# Patient Record
Sex: Female | Born: 1955 | Race: Black or African American | Hispanic: No | Marital: Married | Smoking: Never smoker
Health system: Southern US, Community
[De-identification: ages and names within clinical notes are randomized; demographics above are authoritative.]

## PROBLEM LIST (undated history)

## (undated) DIAGNOSIS — I10 Essential (primary) hypertension: Secondary | ICD-10-CM

## (undated) DIAGNOSIS — E079 Disorder of thyroid, unspecified: Secondary | ICD-10-CM

---

## 2004-11-22 ENCOUNTER — Ambulatory Visit: Payer: Self-pay

## 2005-10-26 ENCOUNTER — Ambulatory Visit: Payer: Self-pay | Admitting: Endocrinology

## 2006-11-21 ENCOUNTER — Ambulatory Visit: Payer: Self-pay | Admitting: Family Medicine

## 2008-01-15 ENCOUNTER — Ambulatory Visit: Payer: Self-pay | Admitting: Family Medicine

## 2008-03-21 ENCOUNTER — Ambulatory Visit: Payer: Self-pay | Admitting: Gastroenterology

## 2009-01-14 ENCOUNTER — Ambulatory Visit: Payer: Self-pay | Admitting: Family Medicine

## 2009-01-21 ENCOUNTER — Ambulatory Visit: Payer: Self-pay | Admitting: Family Medicine

## 2010-01-12 ENCOUNTER — Ambulatory Visit: Payer: Self-pay | Admitting: Family Medicine

## 2010-01-21 ENCOUNTER — Ambulatory Visit: Payer: Self-pay | Admitting: Family Medicine

## 2011-06-29 ENCOUNTER — Ambulatory Visit: Payer: Self-pay | Admitting: Family Medicine

## 2012-07-04 ENCOUNTER — Ambulatory Visit: Payer: Self-pay | Admitting: Family Medicine

## 2013-07-09 ENCOUNTER — Ambulatory Visit: Payer: Self-pay | Admitting: Family Medicine

## 2014-05-23 ENCOUNTER — Other Ambulatory Visit: Payer: Self-pay | Admitting: Family Medicine

## 2014-05-23 DIAGNOSIS — E049 Nontoxic goiter, unspecified: Secondary | ICD-10-CM

## 2014-05-27 ENCOUNTER — Ambulatory Visit
Admission: RE | Admit: 2014-05-27 | Discharge: 2014-05-27 | Disposition: A | Discharge status: Home / Self Care | Payer: BLUE CROSS/BLUE SHIELD | Source: Ambulatory Visit | Attending: Family Medicine | Admitting: Family Medicine

## 2014-05-27 DIAGNOSIS — E010 Iodine-deficiency related diffuse (endemic) goiter: Secondary | ICD-10-CM | POA: Insufficient documentation

## 2014-05-27 DIAGNOSIS — E049 Nontoxic goiter, unspecified: Secondary | ICD-10-CM | POA: Diagnosis present

## 2014-07-07 ENCOUNTER — Other Ambulatory Visit: Payer: Self-pay | Admitting: Family Medicine

## 2014-07-07 DIAGNOSIS — Z1231 Encounter for screening mammogram for malignant neoplasm of breast: Secondary | ICD-10-CM

## 2014-07-15 ENCOUNTER — Ambulatory Visit
Admission: RE | Admit: 2014-07-15 | Discharge: 2014-07-15 | Disposition: A | Discharge status: Home / Self Care | Payer: BLUE CROSS/BLUE SHIELD | Source: Ambulatory Visit | Attending: Family Medicine | Admitting: Family Medicine

## 2014-07-15 DIAGNOSIS — Z1231 Encounter for screening mammogram for malignant neoplasm of breast: Secondary | ICD-10-CM | POA: Insufficient documentation

## 2014-12-05 ENCOUNTER — Inpatient Hospital Stay: Payer: Worker's Compensation | Admitting: Anesthesiology

## 2014-12-05 ENCOUNTER — Inpatient Hospital Stay: Payer: Worker's Compensation

## 2014-12-05 ENCOUNTER — Encounter
Admission: EM | Disposition: A | Discharge status: Home Health | Payer: Self-pay | Source: Home / Self Care | Attending: Internal Medicine

## 2014-12-05 ENCOUNTER — Emergency Department: Payer: Worker's Compensation

## 2014-12-05 ENCOUNTER — Encounter: Payer: Self-pay | Admitting: Emergency Medicine

## 2014-12-05 ENCOUNTER — Inpatient Hospital Stay
Admission: EM | Admit: 2014-12-05 | Discharge: 2014-12-08 | DRG: 470 | Disposition: A | Discharge status: Home Health | Payer: Worker's Compensation | Attending: Internal Medicine | Admitting: Internal Medicine

## 2014-12-05 DIAGNOSIS — D62 Acute posthemorrhagic anemia: Secondary | ICD-10-CM | POA: Diagnosis not present

## 2014-12-05 DIAGNOSIS — W19XXXA Unspecified fall, initial encounter: Secondary | ICD-10-CM | POA: Diagnosis present

## 2014-12-05 DIAGNOSIS — G8918 Other acute postprocedural pain: Secondary | ICD-10-CM

## 2014-12-05 DIAGNOSIS — R739 Hyperglycemia, unspecified: Secondary | ICD-10-CM

## 2014-12-05 DIAGNOSIS — S72012A Unspecified intracapsular fracture of left femur, initial encounter for closed fracture: Secondary | ICD-10-CM | POA: Diagnosis present

## 2014-12-05 DIAGNOSIS — Z419 Encounter for procedure for purposes other than remedying health state, unspecified: Secondary | ICD-10-CM

## 2014-12-05 DIAGNOSIS — M1612 Unilateral primary osteoarthritis, left hip: Secondary | ICD-10-CM | POA: Diagnosis present

## 2014-12-05 DIAGNOSIS — Z880 Allergy status to penicillin: Secondary | ICD-10-CM

## 2014-12-05 DIAGNOSIS — I10 Essential (primary) hypertension: Secondary | ICD-10-CM | POA: Diagnosis present

## 2014-12-05 DIAGNOSIS — E876 Hypokalemia: Secondary | ICD-10-CM | POA: Diagnosis present

## 2014-12-05 DIAGNOSIS — S72002A Fracture of unspecified part of neck of left femur, initial encounter for closed fracture: Secondary | ICD-10-CM | POA: Diagnosis present

## 2014-12-05 DIAGNOSIS — Z79899 Other long term (current) drug therapy: Secondary | ICD-10-CM | POA: Diagnosis not present

## 2014-12-05 DIAGNOSIS — E039 Hypothyroidism, unspecified: Secondary | ICD-10-CM | POA: Diagnosis present

## 2014-12-05 DIAGNOSIS — E1165 Type 2 diabetes mellitus with hyperglycemia: Secondary | ICD-10-CM | POA: Diagnosis present

## 2014-12-05 HISTORY — PX: TOTAL HIP ARTHROPLASTY: SHX124

## 2014-12-05 HISTORY — DX: Essential (primary) hypertension: I10

## 2014-12-05 HISTORY — DX: Disorder of thyroid, unspecified: E07.9

## 2014-12-05 LAB — CBC WITH DIFFERENTIAL/PLATELET
Basophils Absolute: 0 10*3/uL (ref 0–0.1)
Basophils Relative: 1 %
EOS PCT: 0 %
Eosinophils Absolute: 0 10*3/uL (ref 0–0.7)
HCT: 36.6 % (ref 35.0–47.0)
Hemoglobin: 12.3 g/dL (ref 12.0–16.0)
LYMPHS ABS: 1.3 10*3/uL (ref 1.0–3.6)
LYMPHS PCT: 17 %
MCH: 30.5 pg (ref 26.0–34.0)
MCHC: 33.6 g/dL (ref 32.0–36.0)
MCV: 90.6 fL (ref 80.0–100.0)
MONO ABS: 0.4 10*3/uL (ref 0.2–0.9)
MONOS PCT: 5 %
Neutro Abs: 5.9 10*3/uL (ref 1.4–6.5)
Neutrophils Relative %: 77 %
PLATELETS: 250 10*3/uL (ref 150–440)
RBC: 4.04 MIL/uL (ref 3.80–5.20)
RDW: 13.8 % (ref 11.5–14.5)
WBC: 7.7 10*3/uL (ref 3.6–11.0)

## 2014-12-05 LAB — COMPREHENSIVE METABOLIC PANEL
ALT: 13 U/L — AB (ref 14–54)
AST: 21 U/L (ref 15–41)
Albumin: 3.8 g/dL (ref 3.5–5.0)
Alkaline Phosphatase: 48 U/L (ref 38–126)
Anion gap: 6 (ref 5–15)
BUN: 12 mg/dL (ref 6–20)
CALCIUM: 8.7 mg/dL — AB (ref 8.9–10.3)
CO2: 29 mmol/L (ref 22–32)
CREATININE: 0.62 mg/dL (ref 0.44–1.00)
Chloride: 106 mmol/L (ref 101–111)
Glucose, Bld: 119 mg/dL — ABNORMAL HIGH (ref 65–99)
Potassium: 3.4 mmol/L — ABNORMAL LOW (ref 3.5–5.1)
Sodium: 141 mmol/L (ref 135–145)
Total Bilirubin: 0.9 mg/dL (ref 0.3–1.2)
Total Protein: 7.4 g/dL (ref 6.5–8.1)

## 2014-12-05 LAB — SURGICAL PCR SCREEN
MRSA, PCR: NEGATIVE
STAPHYLOCOCCUS AUREUS: NEGATIVE

## 2014-12-05 LAB — PROTIME-INR
INR: 1.1
PROTHROMBIN TIME: 14.4 s (ref 11.4–15.0)

## 2014-12-05 LAB — MAGNESIUM: Magnesium: 1.9 mg/dL (ref 1.7–2.4)

## 2014-12-05 SURGERY — ARTHROPLASTY, HIP, TOTAL, ANTERIOR APPROACH
Anesthesia: General | Site: Hip | Laterality: Left | Wound class: Clean

## 2014-12-05 MED ORDER — ACETAMINOPHEN 500 MG PO TABS
1000.0000 mg | ORAL_TABLET | Freq: Four times a day (QID) | ORAL | Status: AC
  Administered 2014-12-05 – 2014-12-06 (×3): 1000 mg via ORAL
  Filled 2014-12-05 (×4): qty 2

## 2014-12-05 MED ORDER — IPRATROPIUM BROMIDE 0.06 % NA SOLN
2.0000 | Freq: Three times a day (TID) | NASAL | Status: DC
  Administered 2014-12-05 – 2014-12-08 (×9): 2 via NASAL
  Filled 2014-12-05: qty 15

## 2014-12-05 MED ORDER — MAGNESIUM HYDROXIDE 400 MG/5ML PO SUSP
30.0000 mL | Freq: Every day | ORAL | Status: DC | PRN
  Administered 2014-12-06: 30 mL via ORAL
  Filled 2014-12-05: qty 30

## 2014-12-05 MED ORDER — ALUM & MAG HYDROXIDE-SIMETH 200-200-20 MG/5ML PO SUSP: 30.0000 mL | ORAL | Status: DC | PRN

## 2014-12-05 MED ORDER — PHENYLEPHRINE HCL 10 MG/ML IJ SOLN
INTRAMUSCULAR | Status: DC | PRN
  Administered 2014-12-05: 100 ug via INTRAVENOUS

## 2014-12-05 MED ORDER — OXYCODONE HCL 5 MG PO TABS: 5.0000 mg | ORAL_TABLET | Freq: Once | ORAL | Status: DC | PRN

## 2014-12-05 MED ORDER — OXYCODONE HCL 5 MG/5ML PO SOLN: 5.0000 mg | Freq: Once | ORAL | Status: DC | PRN

## 2014-12-05 MED ORDER — OXYCODONE HCL 5 MG PO TABS
5.0000 mg | ORAL_TABLET | ORAL | Status: DC | PRN
  Administered 2014-12-06: 5 mg via ORAL
  Administered 2014-12-06: 10 mg via ORAL
  Administered 2014-12-06: 5 mg via ORAL
  Administered 2014-12-07 (×3): 10 mg via ORAL
  Filled 2014-12-05: qty 2
  Filled 2014-12-05: qty 1
  Filled 2014-12-05: qty 2
  Filled 2014-12-05: qty 1
  Filled 2014-12-05 (×2): qty 2

## 2014-12-05 MED ORDER — HYDROMORPHONE HCL 1 MG/ML IJ SOLN
1.0000 mg | Freq: Once | INTRAMUSCULAR | Status: AC
  Administered 2014-12-05: 1 mg via INTRAVENOUS
  Filled 2014-12-05: qty 1

## 2014-12-05 MED ORDER — TRANEXAMIC ACID 1000 MG/10ML IV SOLN
1000.0000 mg | INTRAVENOUS | Status: DC | PRN
  Administered 2014-12-05: 1000 mg via INTRAVENOUS

## 2014-12-05 MED ORDER — DOCUSATE SODIUM 100 MG PO CAPS
100.0000 mg | ORAL_CAPSULE | Freq: Two times a day (BID) | ORAL | Status: DC
  Administered 2014-12-05 – 2014-12-08 (×6): 100 mg via ORAL
  Filled 2014-12-05 (×6): qty 1

## 2014-12-05 MED ORDER — PHENOL 1.4 % MT LIQD: 1.0000 | OROMUCOSAL | Status: DC | PRN

## 2014-12-05 MED ORDER — ENOXAPARIN SODIUM 40 MG/0.4ML ~~LOC~~ SOLN
40.0000 mg | SUBCUTANEOUS | Status: DC
  Administered 2014-12-06 – 2014-12-08 (×3): 40 mg via SUBCUTANEOUS
  Filled 2014-12-05 (×3): qty 0.4

## 2014-12-05 MED ORDER — HYDROCHLOROTHIAZIDE 25 MG PO TABS
25.0000 mg | ORAL_TABLET | Freq: Every day | ORAL | Status: DC
  Administered 2014-12-06 – 2014-12-08 (×3): 25 mg via ORAL
  Filled 2014-12-05 (×3): qty 1

## 2014-12-05 MED ORDER — DEXAMETHASONE SODIUM PHOSPHATE 4 MG/ML IJ SOLN
INTRAMUSCULAR | Status: DC | PRN
  Administered 2014-12-05: 10 mg via INTRAVENOUS

## 2014-12-05 MED ORDER — PROPOFOL 10 MG/ML IV BOLUS
INTRAVENOUS | Status: DC | PRN
  Administered 2014-12-05: 150 mg via INTRAVENOUS

## 2014-12-05 MED ORDER — MORPHINE SULFATE (PF) 2 MG/ML IV SOLN: 2.0000 mg | INTRAVENOUS | Status: DC | PRN

## 2014-12-05 MED ORDER — NEOMYCIN-POLYMYXIN B GU 40-200000 IR SOLN
Status: DC | PRN
  Administered 2014-12-05: 4 mL

## 2014-12-05 MED ORDER — ACETAMINOPHEN 10 MG/ML IV SOLN
INTRAVENOUS | Status: DC | PRN
  Administered 2014-12-05: 1000 mg via INTRAVENOUS

## 2014-12-05 MED ORDER — ACETAMINOPHEN 650 MG RE SUPP: 650.0000 mg | Freq: Four times a day (QID) | RECTAL | Status: DC | PRN

## 2014-12-05 MED ORDER — CLINDAMYCIN PHOSPHATE 900 MG/50ML IV SOLN
900.0000 mg | Freq: Once | INTRAVENOUS | Status: AC
  Administered 2014-12-05: 900 mg via INTRAVENOUS
  Filled 2014-12-05 (×2): qty 50

## 2014-12-05 MED ORDER — FENTANYL CITRATE (PF) 100 MCG/2ML IJ SOLN
INTRAMUSCULAR | Status: DC | PRN
Start: 2014-12-05 — End: 2014-12-05
  Administered 2014-12-05 (×2): 50 ug via INTRAVENOUS
  Administered 2014-12-05: 100 ug via INTRAVENOUS

## 2014-12-05 MED ORDER — POTASSIUM CHLORIDE IN NACL 20-0.9 MEQ/L-% IV SOLN
INTRAVENOUS | Status: DC
Start: 2014-12-05 — End: 2014-12-05
  Administered 2014-12-05: 12:00:00 via INTRAVENOUS
  Filled 2014-12-05 (×3): qty 1000

## 2014-12-05 MED ORDER — ACETAMINOPHEN 325 MG PO TABS: 650.0000 mg | ORAL_TABLET | Freq: Four times a day (QID) | ORAL | Status: DC | PRN

## 2014-12-05 MED ORDER — MORPHINE SULFATE (PF) 4 MG/ML IV SOLN
4.0000 mg | INTRAVENOUS | Status: DC | PRN
  Administered 2014-12-05: 4 mg via INTRAVENOUS
  Filled 2014-12-05: qty 1

## 2014-12-05 MED ORDER — DIPHENHYDRAMINE HCL 12.5 MG/5ML PO ELIX: 12.5000 mg | ORAL_SOLUTION | ORAL | Status: DC | PRN

## 2014-12-05 MED ORDER — LACTATED RINGERS IV SOLN
INTRAVENOUS | Status: DC | PRN
  Administered 2014-12-05 (×2): via INTRAVENOUS

## 2014-12-05 MED ORDER — METHOCARBAMOL 500 MG PO TABS
500.0000 mg | ORAL_TABLET | Freq: Four times a day (QID) | ORAL | Status: DC | PRN
  Administered 2014-12-06: 500 mg via ORAL
  Filled 2014-12-05: qty 1

## 2014-12-05 MED ORDER — MIDAZOLAM HCL 2 MG/2ML IJ SOLN
INTRAMUSCULAR | Status: DC | PRN
  Administered 2014-12-05: 2 mg via INTRAVENOUS

## 2014-12-05 MED ORDER — FENTANYL CITRATE (PF) 100 MCG/2ML IJ SOLN: 25.0000 ug | INTRAMUSCULAR | Status: DC | PRN

## 2014-12-05 MED ORDER — ONDANSETRON HCL 4 MG PO TABS: 4.0000 mg | ORAL_TABLET | Freq: Four times a day (QID) | ORAL | Status: DC | PRN

## 2014-12-05 MED ORDER — ONDANSETRON HCL 4 MG/2ML IJ SOLN
4.0000 mg | Freq: Four times a day (QID) | INTRAMUSCULAR | Status: DC | PRN
  Administered 2014-12-05: 4 mg via INTRAVENOUS

## 2014-12-05 MED ORDER — ROCURONIUM BROMIDE 100 MG/10ML IV SOLN
INTRAVENOUS | Status: DC | PRN
  Administered 2014-12-05: 30 mg via INTRAVENOUS
  Administered 2014-12-05: 20 mg via INTRAVENOUS

## 2014-12-05 MED ORDER — BISACODYL 10 MG RE SUPP: 10.0000 mg | Freq: Every day | RECTAL | Status: DC | PRN

## 2014-12-05 MED ORDER — CLINDAMYCIN PHOSPHATE 900 MG/50ML IV SOLN
900.0000 mg | Freq: Four times a day (QID) | INTRAVENOUS | Status: AC
  Administered 2014-12-05 – 2014-12-06 (×3): 900 mg via INTRAVENOUS
  Filled 2014-12-05 (×3): qty 50

## 2014-12-05 MED ORDER — LEVOTHYROXINE SODIUM 88 MCG PO TABS: 88.0000 ug | ORAL_TABLET | ORAL | Status: DC

## 2014-12-05 MED ORDER — KETOROLAC TROMETHAMINE 30 MG/ML IJ SOLN
INTRAMUSCULAR | Status: DC | PRN
  Administered 2014-12-05: 30 mg via INTRAVENOUS

## 2014-12-05 MED ORDER — ALBUTEROL SULFATE (2.5 MG/3ML) 0.083% IN NEBU: 2.5000 mg | INHALATION_SOLUTION | RESPIRATORY_TRACT | Status: DC | PRN

## 2014-12-05 MED ORDER — MAGNESIUM CITRATE PO SOLN: 1.0000 | Freq: Once | ORAL | Status: DC | PRN

## 2014-12-05 MED ORDER — BUPIVACAINE-EPINEPHRINE 0.25% -1:200000 IJ SOLN
INTRAMUSCULAR | Status: DC | PRN
  Administered 2014-12-05: 30 mL

## 2014-12-05 MED ORDER — ZOLPIDEM TARTRATE 5 MG PO TABS: 5.0000 mg | ORAL_TABLET | Freq: Every evening | ORAL | Status: DC | PRN

## 2014-12-05 MED ORDER — SODIUM CHLORIDE 0.9 % IV SOLN
INTRAVENOUS | Status: DC
  Administered 2014-12-05 – 2014-12-08 (×5): via INTRAVENOUS

## 2014-12-05 MED ORDER — MENTHOL 3 MG MT LOZG: 1.0000 | LOZENGE | OROMUCOSAL | Status: DC | PRN

## 2014-12-05 MED ORDER — DEXTROSE 5 % IV SOLN: 500.0000 mg | Freq: Four times a day (QID) | INTRAVENOUS | Status: DC | PRN

## 2014-12-05 MED ORDER — SODIUM CHLORIDE 0.9 % IV SOLN: INTRAVENOUS | Status: DC

## 2014-12-05 MED ORDER — LIDOCAINE HCL (CARDIAC) 20 MG/ML IV SOLN
INTRAVENOUS | Status: DC | PRN
  Administered 2014-12-05: 80 mg via INTRAVENOUS

## 2014-12-05 MED ORDER — SUGAMMADEX SODIUM 200 MG/2ML IV SOLN
INTRAVENOUS | Status: DC | PRN
  Administered 2014-12-05: 142 mg via INTRAVENOUS

## 2014-12-05 SURGICAL SUPPLY — 43 items
BLADE SAW 1/2 (BLADE) ×3 IMPLANT
BNDG COHESIVE 6X5 TAN STRL LF (GAUZE/BANDAGES/DRESSINGS) ×6 IMPLANT
CANISTER SUCT 1200ML W/VALVE (MISCELLANEOUS) ×3 IMPLANT
CAPT HIP TOTAL 3 ×3 IMPLANT
CATH FOL LEG HOLDER (MISCELLANEOUS) ×3 IMPLANT
CATH TRAY METER 16FR LF (MISCELLANEOUS) ×3 IMPLANT
CHLORAPREP W/TINT 26ML (MISCELLANEOUS) ×3 IMPLANT
DRAPE C-ARM XRAY 36X54 (DRAPES) ×3 IMPLANT
DRAPE INCISE IOBAN 66X60 STRL (DRAPES) IMPLANT
DRAPE POUCH INSTRU U-SHP 10X18 (DRAPES) ×3 IMPLANT
DRAPE SHEET LG 3/4 BI-LAMINATE (DRAPES) ×9 IMPLANT
DRAPE TABLE BACK 80X90 (DRAPES) ×3 IMPLANT
ELECT BLADE 6.5 EXT (BLADE) ×3 IMPLANT
GAUZE SPONGE 4X4 12PLY STRL (GAUZE/BANDAGES/DRESSINGS) ×3 IMPLANT
GLOVE BIOGEL PI IND STRL 9 (GLOVE) ×1 IMPLANT
GLOVE BIOGEL PI INDICATOR 9 (GLOVE) ×2
GLOVE SURG ORTHO 9.0 STRL STRW (GLOVE) ×3 IMPLANT
GOWN SPECIALTY ULTRA XL (MISCELLANEOUS) ×3 IMPLANT
GOWN STRL REUS W/ TWL LRG LVL3 (GOWN DISPOSABLE) ×1 IMPLANT
GOWN STRL REUS W/TWL LRG LVL3 (GOWN DISPOSABLE) ×2
HEMOVAC 400CC 10FR (MISCELLANEOUS) ×3 IMPLANT
HOOD PEEL AWAY FACE SHEILD DIS (HOOD) ×3 IMPLANT
MAT BLUE FLOOR 46X72 FLO (MISCELLANEOUS) ×3 IMPLANT
NDL SAFETY 18GX1.5 (NEEDLE) ×3 IMPLANT
NEEDLE SPNL 18GX3.5 QUINCKE PK (NEEDLE) ×3 IMPLANT
NS IRRIG 1000ML POUR BTL (IV SOLUTION) ×3 IMPLANT
PACK HIP COMPR (MISCELLANEOUS) ×3 IMPLANT
SOL PREP PVP 2OZ (MISCELLANEOUS) ×3
SOLUTION PREP PVP 2OZ (MISCELLANEOUS) ×1 IMPLANT
STAPLER SKIN PROX 35W (STAPLE) ×3 IMPLANT
STRAP SAFETY BODY (MISCELLANEOUS) ×3 IMPLANT
SUT DVC 2 QUILL PDO  T11 36X36 (SUTURE) ×2
SUT DVC 2 QUILL PDO T11 36X36 (SUTURE) ×1 IMPLANT
SUT DVC QUILL MONODERM 30X30 (SUTURE) ×3 IMPLANT
SUT ETHIBOND NAB CT1 #1 30IN (SUTURE) ×3 IMPLANT
SUT SILK 0 (SUTURE) ×2
SUT SILK 0 30XBRD TIE 6 (SUTURE) ×1 IMPLANT
SUT VIC AB 1 CT1 36 (SUTURE) ×3 IMPLANT
SYR 20CC LL (SYRINGE) ×3 IMPLANT
SYR 30ML LL (SYRINGE) ×3 IMPLANT
TAPE MICROFOAM 4IN (TAPE) ×3 IMPLANT
TUBE KAMVAC SUCTION (TUBING) ×3 IMPLANT
WATER STERILE IRR 1000ML POUR (IV SOLUTION) ×3 IMPLANT

## 2014-12-05 NOTE — Care Management Note (Signed)
Case Management Note  Patient Details  Name: Latoya Warren MRN: [id number] Date of Birth: 1955-08-17  Subjective/Objective:       Call to Will at [facility] requesting a bedside commode and a rolling walker to be delivered to [name] room today. She is scheduled for total hip surgery this afternoon.              Action/Plan:   Expected Discharge Date:                  Expected Discharge Plan:     In-House Referral:     Discharge planning Services     Post Acute Care Choice:    Choice offered to:     DME Arranged:    DME Agency:     HH Arranged:    HH Agency:     Status of Service:     Medicare Important Message Given:    Date Medicare IM Given:    Medicare IM give by:    Date Additional Medicare IM Given:    Additional Medicare Important Message give by:     If discussed at Long Length of Stay Meetings, dates discussed:    Additional Comments:  Daxten Kovalenko A, RN 12/05/2014, 11:59 AM

## 2014-12-05 NOTE — H&P (Signed)
[facility] [place] [facility]   PATIENT NAME: Latoya Warren    MR#:  [id number]  DATE OF BIRTH:  [birth year]  DATE OF ADMISSION:  12/05/2014  PRIMARY CARE PHYSICIAN: Leim Fabry, MD   REQUESTING/REFERRING PHYSICIAN: Emily Filbert, MD  CHIEF COMPLAINT:   Chief Complaint  Patient presents with  . Fall   fall accident at work today.  HISTORY OF PRESENT ILLNESS:  Latoya Warren  is a 59 y.o. female with a known history of hypertension and hypothyroidism. Patient feel by accident at work this morning. She denies any syncope or loss of consciousness or seizure. She complains of left hip pain after fall. X-ray shows left femoral fracture.  PAST MEDICAL HISTORY:   Past Medical History  Diagnosis Date  . Hypertension   . Thyroid disease     PAST SURGICAL HISTORY:  History reviewed. No pertinent past surgical history.  SOCIAL HISTORY:   Social History  Substance Use Topics  . Smoking status: Never Smoker   . Smokeless tobacco: Not on file  . Alcohol Use: No    FAMILY HISTORY:  No family history on file.  DRUG ALLERGIES:   Allergies  Allergen Reactions  . Penicillins Rash    REVIEW OF SYSTEMS:  CONSTITUTIONAL: No fever, fatigue or weakness.  EYES: No blurred or double vision.  EARS, NOSE, AND THROAT: No tinnitus or ear pain.  RESPIRATORY: No cough, shortness of breath, wheezing or hemoptysis.  CARDIOVASCULAR: No chest pain, orthopnea, edema.  GASTROINTESTINAL: No nausea, vomiting, diarrhea or abdominal pain.  GENITOURINARY: No dysuria, hematuria.  ENDOCRINE: No polyuria, nocturia,  HEMATOLOGY: No anemia, easy bruising or bleeding SKIN: No rash or lesion. MUSCULOSKELETAL: Left hip pain. NEUROLOGIC: No tingling, numbness, weakness.  PSYCHIATRY: No anxiety or depression.   MEDICATIONS AT HOME:   Prior to Admission medications   Medication Sig Start Date End Date Taking? Authorizing Provider  acetaminophen (RA  ACETAMINOPHEN) 650 MG CR tablet Take 650 mg by mouth every 8 (eight) hours as needed for pain.    Yes Historical Provider, MD  hydrochlorothiazide (HYDRODIURIL) 25 MG tablet Take 25 mg by mouth daily.   Yes Historical Provider, MD  ipratropium (ATROVENT) 0.06 % nasal spray Place 2 sprays into both nostrils 3 (three) times daily. For 14 days 12/02/14  Yes Historical Provider, MD  levothyroxine (SYNTHROID, LEVOTHROID) 75 MCG tablet Take 75 mcg by mouth daily. Except on Tuesday and Thursday   Yes Historical Provider, MD  levothyroxine (SYNTHROID, LEVOTHROID) 88 MCG tablet Take 88 mcg by mouth daily. On Tuesday and Thursday   Yes Historical Provider, MD  Multiple Vitamins-Minerals (MULTIVITAMIN WITH MINERALS) tablet Take 1 tablet by mouth daily.   Yes Historical Provider, MD      VITAL SIGNS:  Blood pressure 149/80, pulse 94, temperature 98.3 F (36.8 C), temperature source Oral, resp. rate 16, height  (1.6 m), weight 71.033 kg (156 lb 9.6 oz), SpO2 94 %.  PHYSICAL EXAMINATION:  GENERAL:  59 y.o.-year-old patient lying in the bed with no acute distress.  EYES: Pupils equal, round, reactive to light and accommodation. No scleral icterus. Extraocular muscles intact.  HEENT: Head atraumatic, normocephalic. Oropharynx and nasopharynx clear. Dry oral mucosa. NECK:  Supple, no jugular venous distention. No thyroid enlargement, no tenderness.  LUNGS: Normal breath sounds bilaterally, no wheezing, rales,rhonchi or crepitation. No use of accessory muscles of respiration.  CARDIOVASCULAR: S1, S2 normal. No murmurs, rubs, or gallops.  ABDOMEN: Soft, nontender, nondistended. Bowel sounds present. No organomegaly or  mass.  EXTREMITIES: No pedal edema, cyanosis, or clubbing.  NEUROLOGIC: Cranial nerves II through XII are intact. Muscle strength 5/5 in all extremities except left lower extremity. Sensation intact. Gait not checked.  PSYCHIATRIC: The patient is alert and oriented x 3.  SKIN: No obvious rash,  lesion, or ulcer.   LABORATORY PANEL:   CBC  Recent Labs Lab 12/05/14 0908  WBC 7.7  HGB 12.3  HCT 36.6  PLT 250   ------------------------------------------------------------------------------------------------------------------  Chemistries   Recent Labs Lab 12/05/14 0951  NA 141  K 3.4*  CL 106  CO2 29  GLUCOSE 119*  BUN 12  CREATININE 0.62  CALCIUM 8.7*  AST 21  ALT 13*  ALKPHOS 48  BILITOT 0.9   ------------------------------------------------------------------------------------------------------------------  Cardiac Enzymes No results for input(s): TROPONINI in the last 168 hours. ------------------------------------------------------------------------------------------------------------------  RADIOLOGY:  Dg Chest 1 View  12/05/2014  CLINICAL DATA:  Pain following fall EXAM: CHEST 1 VIEW COMPARISON:  None. FINDINGS: Lungs are clear. Heart size and pulmonary vascularity are normal. No adenopathy. No pneumothorax. There is thoracolumbar levoscoliosis. No acute fracture evident. IMPRESSION: No edema or consolidation. Electronically Signed   By: Bretta BangWilliam  Woodruff III M.D.   On: 12/05/2014 08:46   Dg Pelvis 1-2 Views  12/05/2014  CLINICAL DATA:  Fall onto concrete at work with hip pain, initial encounter EXAM: PELVIS - 1-2 VIEW COMPARISON:  None. FINDINGS: There is evidence of subcapital femoral neck fracture with impaction and angulation at the fracture site. The pelvic ring is intact. IMPRESSION: Subcapital left femoral neck fracture. Electronically Signed   By: Alcide CleverMark  Lukens M.D.   On: 12/05/2014 08:45   Dg Femur Port Min 2 Views Left  12/05/2014  CLINICAL DATA:  Recent fall at work onto concrete with left hip pain, initial encounter EXAM: LEFT FEMUR PORTABLE 2 VIEWS COMPARISON:  None. FINDINGS: Subcapital left femoral neck fracture is noted with impaction and angulation at the fracture site. The more distal femur is intact. No gross soft tissue abnormality is  seen. IMPRESSION: Subcapital left femoral neck fracture. Electronically Signed   By: Alcide CleverMark  Lukens M.D.   On: 12/05/2014 08:48    EKG:   Orders placed or performed during the hospital encounter of 12/05/14  . ED EKG  . ED EKG  . EKG 12-Lead  . EKG 12-Lead  . EKG 12-Lead  . EKG 12-Lead    IMPRESSION AND PLAN:    Left hip fracture Hypokalemia HTN Hypothyrodism.  Follow up Dr. Rosita KeaMenz for surgery. DVT prophylaxis and PT after surgery. Pain control. Potassium supplement. Follow up BMP and magnesium in a.m. Continue HCTZ, synthroid.   All the records are reviewed and case discussed with ED provider. Management plans discussed with the patient, her husband and they are in agreement.  CODE STATUS: Full code  TOTAL TIME TAKING CARE OF THIS PATIENT: 50 minutes.    Shaune Pollackhen, Brnadon Eoff M.D on 12/05/2014 at 11:41 AM  Between 7am to 6pm - Pager - [phone]  After 6pm go to [email or web address] - password [email or web address] [facility]  [facility]  Office  [phone]  CC: Primary care physician; Leim FabryALDRIDGE,BARBARA, MD

## 2014-12-05 NOTE — Anesthesia Procedure Notes (Signed)
Procedure Name: Intubation Date/Time: 12/05/2014 4:38 PM Performed by: Stormy FabianURTIS, Emilie Carp Pre-anesthesia Checklist: Patient identified, Emergency Drugs available, Suction available and Patient being monitored Patient Re-evaluated:Patient Re-evaluated prior to inductionOxygen Delivery Method: Circle system utilized Preoxygenation: Pre-oxygenation with 100% oxygen Intubation Type: IV induction, Cricoid Pressure applied and Rapid sequence Ventilation: Mask ventilation without difficulty Laryngoscope Size: Mac and 3 Grade View: Grade I Tube type: Oral Tube size: 7.0 mm Number of attempts: 1 Airway Equipment and Method: Stylet Placement Confirmation: ETT inserted through vocal cords under direct vision,  positive ETCO2 and breath sounds checked- equal and bilateral Secured at: 21 cm Tube secured with: Tape Dental Injury: Teeth and Oropharynx as per pre-operative assessment

## 2014-12-05 NOTE — ED Provider Notes (Signed)
[facility] Emergency Department Provider Note     Time seen: ----------------------------------------- 7:42 AM on 12/05/2014 -----------------------------------------    I have reviewed the triage vital signs and the nursing notes.   HISTORY  Chief Complaint No chief complaint on file.    HPI Latoya Warren is a 59 y.o. female brought to the ER by EMS after a fall while at work. Patient states she fell and landed on her left side. She is complaining of severe degenerative pain in the left hip and some also in the left knee. Movement of the left leg makes her symptoms worse, she received morphine in route which helped her symptoms.   No past medical history on file.  There are no active problems to display for this patient.   No past surgical history on file.  Allergies Review of patient's allergies indicates not on file.  Social History Social History  Substance Use Topics  . Smoking status: Not on file  . Smokeless tobacco: Not on file  . Alcohol Use: Not on file    Review of Systems Constitutional: Negative for fever. Eyes: Negative for visual changes. ENT: Negative for sore throat. Cardiovascular: Negative for chest pain. Respiratory: Negative for shortness of breath. Gastrointestinal: Negative for abdominal pain, vomiting and diarrhea. Genitourinary: Negative for dysuria. Musculoskeletal: Positive for left hip and knee pain Skin: Negative for rash. Neurological: Negative for headaches, focal weakness or numbness.  10-point ROS otherwise negative.  ____________________________________________   PHYSICAL EXAM:  VITAL SIGNS: ED Triage Vitals  Enc Vitals Group     BP --      Pulse --      Resp --      Temp --      Temp src --      SpO2 --      Weight --      Height --      Head Cir --      Peak Flow --      Pain Score --      Pain Loc --      Pain Edu? --      Excl. in GC? --     Constitutional: Alert and  oriented. Well appearing and in no distress. Eyes: Conjunctivae are normal. PERRL. Normal extraocular movements. ENT   Head: Normocephalic and atraumatic.   Nose: No congestion/rhinnorhea.   Mouth/Throat: Mucous membranes are moist.   Neck: No stridor. Cardiovascular: Normal rate, regular rhythm. Normal and symmetric distal pulses are present in all extremities. No murmurs, rubs, or gallops. Respiratory: Normal respiratory effort without tachypnea nor retractions. Breath sounds are clear and equal bilaterally. No wheezes/rales/rhonchi. Gastrointestinal: Soft and nontender. No distention. No abdominal bruits.  Musculoskeletal: Severe pain with range of motion of the left hip. There is no focal tenderness on examination of her knee. Neurologic:  Normal speech and language. No gross focal neurologic deficits are appreciated. Speech is normal. No gait instability. Skin:  Skin is warm, dry and intact. No rash noted. Psychiatric: Mood and affect are normal. Speech and behavior are normal. Patient exhibits appropriate insight and judgment. ____________________________________________  EKG: Interpreted by me. Normal sinus rhythm rate 75 bpm, normal PR interval, normal QRS with, normal QT interval. Low voltage.  ____________________________________________  ED COURSE:  Pertinent labs & imaging results that were available during my care of the patient were reviewed by me and considered in my medical decision making (see chart for details). We'll obtain hip and knee x-rays and reevaluate. ____________________________________________  LABS (pertinent positives/negatives)  Labs Reviewed  CBC WITH DIFFERENTIAL/PLATELET  COMPREHENSIVE METABOLIC PANEL  PROTIME-INR    RADIOLOGY Images were viewed by me  Left hip, knee x-rays  IMPRESSION: Subcapital left femoral neck fracture. IMPRESSION: No edema or consolidation. ____________________________________________  FINAL  ASSESSMENT AND PLAN  Fall, left femoral neck fracture  Plan: Patient with labs and imaging as dictated above. Patient is in no acute distress, is likely a good candidate for surgery. Will discuss with orthopedics and the hospitalist for admission.   Emily FilbertWilliams, Jonathan E, MD   Emily FilbertJonathan E Williams, MD 12/05/14 [phone]

## 2014-12-05 NOTE — Op Note (Signed)
12/05/2014  6:49 PM  PATIENT:  Latoya Warren  59 y.o. female  PRE-OPERATIVE DIAGNOSIS:  FX LEFT HIP subcapital displaced with osteoarthritis  POST-OPERATIVE DIAGNOSIS:  FX LEFT HIP, OSTEOARTHRITIS same  PROCEDURE:  Procedure(s): TOTAL HIP ARTHROPLASTY ANTERIOR APPROACH (Left)  SURGEON: Leitha SchullerMichael J Mazal Ebey, MD  ASSISTANTS: None  ANESTHESIA:   general  EBL:  Total I/O In: 1300 [I.V.:1300] Out: 1350 [Urine:950; Blood:400]  BLOOD ADMINISTERED:none  DRAINS: none   LOCAL MEDICATIONS USED:  MARCAINE     SPECIMEN:  Source of Specimen:  Left femoral head  DISPOSITION OF SPECIMEN:  PATHOLOGY  COUNTS:  YES  TOURNIQUET:  * No tourniquets in log *  IMPLANTS: Medacta AMIS  2 stem collared with 52 mm Mpact cup DM and liner, S 28 mm head   DICTATION: .Dragon Dictation   The patient was brought to the operating room anafter general anesthesia was obtained she was placed on the operative table with the ipsilateral foot into the Medacta attachment, contralateral leg on a well-padded table. C-arm was brought in and preop template x-ray taken. After prepping and draping in usual sterile fashion appropriate patient identification and timeout procedures were completed. Anterior approach to the hip was obtained and centered over the greater trochanter and TFL muscle. The subcutaneous tissue was incised hemostasis being achieved by electrocautery. TFL fascia was incised and the muscle retracted laterally deep retractor placed. The lateral femoral circumflex vessels were identified and ligated. The anterior capsule was exposed and a capsulotomy performed. The neck was identified and a femoral neck cut carried out with a saw below the level of the femoral neck fracture which was subcapital. There did not appear to be any abnormality of the bone in the subcapital region or abnormal marrow on gross appearance the head was socially sent for pathologic evaluation . The head was removed without difficulty and  showed sclerotic femoral head and acetabulum. Reaming was carried out to 50 mm and a 52 mm cup trial gave appropriate tightness to the acetabular component a 52 Mpact cup DM  was impacted into position. The leg was then externally rotated and ischiofemoral and pubofemoral releases carried out. The femur was sequentially broached to a sito, 2 stem with standard neck and S head trials were placed and the final components chosen. The 2 stem was inserted along with a a S 28 mm head and 52 mm liner. The hip was reduced and was stable the wound was thoroughly irrigated with a dilute Betadine solution. The deep fawas closed using #1 Vicryl.after infiltration of 30 cc of quarter percent Sensorcaine with epinephrine. 2-0 Quill to close the skin with skin staples Xeand honeycomb dressingatient was sent to recovery in stable condition   PLAN OF CARE: Continue as inpatient

## 2014-12-05 NOTE — ED Notes (Signed)
Spoke with Rudi Rummagelga Jamison w/ pt employer [employer] she states to go ahead with urine screen without photo id.

## 2014-12-05 NOTE — Transfer of Care (Signed)
Immediate Anesthesia Transfer of Care Note  Patient: Latoya Warren  Procedure(s) Performed: Procedure(s): TOTAL HIP ARTHROPLASTY ANTERIOR APPROACH (Left)  Patient Location: PACU  Anesthesia Type:General  Level of Consciousness: sedated  Airway & Oxygen Therapy: Patient Spontanous Breathing and Patient connected to face mask oxygen  Post-op Assessment: Report given to RN and Post -op Vital signs reviewed and stable  Post vital signs: Reviewed and stable  Last Vitals:  Filed Vitals:   12/05/14 1842  BP: 114/81  Pulse: 85  Temp: 36.7 C  Resp: 10    Complications: No apparent anesthesia complications

## 2014-12-05 NOTE — Care Management Note (Addendum)
Case Management Note  Patient Details  Name: Latoya Warren MRN: [id number] Date of Birth: [birth year]  Subjective/Objective:   Called [name] employer, [employer] [place], [place]: [phone], and spoke with [name]. [name] reported that Ms Bing PlumeWilkins Workman's Compensation claim would be handled by [employer], ph: [phone]. Called [employer] to ask if they had any preferences of home health providers and DME equipment.[name] stated that [name] has not filed a claim so they could not give this Clinical research associatewriter any information. This Clinical research associatewriter updated [name] at [employer] that [name] said that no [name] claim has been Building services engineerfiled by [name] at this time. A rolling walker and a bedside commode have been ordered from [facility]. Case management will follow for discharge planning. A total hip repair is planned for this afternoon by Dr Rosita KeaMenz.              Action/Plan:   Expected Discharge Date:                  Expected Discharge Plan:     In-House Referral:     Discharge planning Services     Post Acute Care Choice:    Choice offered to:     DME Arranged:    DME Agency:     HH Arranged:    HH Agency:     Status of Service:     Medicare Important Message Given:    Date Medicare IM Given:    Medicare IM give by:    Date Additional Medicare IM Given:    Additional Medicare Important Message give by:     If discussed at Long Length of Stay Meetings, dates discussed:    Additional Comments:  [name], RN 12/05/2014, 12:12 PM

## 2014-12-05 NOTE — ED Notes (Signed)
Pt fell backward at work, landing on left hip.  Pt denies LOC, no deformity noted.  Pt A/Ox4, pain 10/10 in left hip down to knee w/ movement, hypertensive upon arrival, no immediate distress at this time.  MD at bedside.

## 2014-12-05 NOTE — ED Notes (Signed)
Pt does not have Identification (driver license) , so could not do Urine Drug Screen at this moment. Pt is filing for workman's comp. Husband gone to get the [employer].

## 2014-12-05 NOTE — Progress Notes (Signed)
Lennox Laityalled Ashley, RN back for report. Report received.

## 2014-12-05 NOTE — ED Notes (Signed)
Attempted to call pt employer; left message w/ Rudi Rummagelga Jamison.

## 2014-12-05 NOTE — ED Notes (Signed)
Patient transported to X-ray 

## 2014-12-05 NOTE — Anesthesia Preprocedure Evaluation (Signed)
Anesthesia Evaluation  Patient identified by MRN, date of birth, ID band Patient awake    Reviewed: Allergy & Precautions, H&P , NPO status , Patient's Chart, lab work & pertinent test results  History of Anesthesia Complications Negative for: history of anesthetic complications  Airway Mallampati: III  TM Distance: >3 FB Neck ROM: full    Dental  (+) Poor Dentition, Chipped, Missing, Upper Dentures, Partial Lower   Pulmonary neg pulmonary ROS, neg shortness of breath,    Pulmonary exam normal breath sounds clear to auscultation       Cardiovascular Exercise Tolerance: Good hypertension, (-) angina(-) Past MI and (-) DOE Normal cardiovascular exam Rhythm:regular Rate:Normal     Neuro/Psych negative neurological ROS  negative psych ROS   GI/Hepatic negative GI ROS, Neg liver ROS,   Endo/Other  negative endocrine ROS  Renal/GU negative Renal ROS  negative genitourinary   Musculoskeletal   Abdominal   Peds  Hematology negative hematology ROS (+)   Anesthesia Other Findings Past Medical History:   Hypertension                                                 Thyroid disease                                             History reviewed. No pertinent surgical history.  BMI    Body Mass Index   27.74 kg/m 2      Reproductive/Obstetrics negative OB ROS                             Anesthesia Physical Anesthesia Plan  ASA: III  Anesthesia Plan: General ETT   Post-op Pain Management:    Induction:   Airway Management Planned:   Additional Equipment:   Intra-op Plan:   Post-operative Plan:   Informed Consent: I have reviewed the patients History and Physical, chart, labs and discussed the procedure including the risks, benefits and alternatives for the proposed anesthesia with the patient or authorized representative who has indicated his/her understanding and acceptance.    Dental Advisory Given  Plan Discussed with: Anesthesiologist, CRNA and Surgeon  Anesthesia Plan Comments:         Anesthesia Quick Evaluation

## 2014-12-05 NOTE — Progress Notes (Signed)
Spoke with Dr. Rosita KeaMENZ to clarify iv fluid order. NS 5475ml/h

## 2014-12-05 NOTE — ED Notes (Signed)
Attempted to call report; RN busy at this time. 

## 2014-12-05 NOTE — Consult Note (Signed)
Patient is a 59 year old female who suffered a fall at work while pulling on a order she fell backwards landing on her left side and suffering severe pain. She is brought to the emergency room and found to have a displaced subcapital hip fracture. She does report having some occasional pain in the past in the hip with mild arthritis. 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 She has trace edema in the lower extremity with pulses intact and able to flex extend the toes. Skin is intact about the left hip.  X-rays: X-rays show a completely displaced femoral neck fracture subcapital with mild osteoarthritis of the left hip  Impression displaced femoral neck fracture in an active 59 year old out from work injury with some underlying osteoarthritis  Plan is for total hip replacement anterior approach later today if medically stable and discussed work comp with her as well. Risks, complications, alternatives to surgery discussed and to get her mobilized she will need hemiarthroplasty or total hip and that her age and with some underlying arthritis I think total hip replacement is indicated

## 2014-12-06 LAB — CBC
HCT: 30.4 % — ABNORMAL LOW (ref 35.0–47.0)
Hemoglobin: 10.5 g/dL — ABNORMAL LOW (ref 12.0–16.0)
MCH: 31.2 pg (ref 26.0–34.0)
MCHC: 34.7 g/dL (ref 32.0–36.0)
MCV: 90.1 fL (ref 80.0–100.0)
PLATELETS: 185 10*3/uL (ref 150–440)
RBC: 3.37 MIL/uL — ABNORMAL LOW (ref 3.80–5.20)
RDW: 14 % (ref 11.5–14.5)
WBC: 7.2 10*3/uL (ref 3.6–11.0)

## 2014-12-06 LAB — BASIC METABOLIC PANEL
Anion gap: 4 — ABNORMAL LOW (ref 5–15)
BUN: 12 mg/dL (ref 6–20)
CALCIUM: 8 mg/dL — AB (ref 8.9–10.3)
CO2: 26 mmol/L (ref 22–32)
CREATININE: 0.7 mg/dL (ref 0.44–1.00)
Chloride: 109 mmol/L (ref 101–111)
GFR calc non Af Amer: >60 mL/min (ref >60)
GLUCOSE: 161 mg/dL — AB (ref 65–99)
Potassium: 4.2 mmol/L (ref 3.5–5.1)
Sodium: 139 mmol/L (ref 135–145)

## 2014-12-06 MED ORDER — SODIUM CHLORIDE 0.9 % IV BOLUS (SEPSIS)
1000.0000 mL | Freq: Once | INTRAVENOUS | Status: AC
  Administered 2014-12-06: 1000 mL via INTRAVENOUS

## 2014-12-06 NOTE — Progress Notes (Signed)
Physical Therapy Treatment Patient Details Name: Latoya Warren MRN: [id number] DOB: 01/11/1956 Today's Date: 12/06/2014    History of Present Illness Pt is a 59 yo female who fell at work and sustained a L displaced subcapital hip fracture and is now POD #1 L anterior hip replacement.    PT Comments    Pt with improved bed mobility this session, able to move L LE across bed with SBA.  Good balance and safety awareness with ambulation, pt able to increase gait distance to 50' using RW with SBA.  Handout and therex demonstrated by patient and reviewed with spouse. Cont with POC.  Follow Up Recommendations  Home health PT     Equipment Recommendations  Rolling walker with 5" wheels;3in1 (PT)    Recommendations for Other Services       Precautions / Restrictions Precautions Precautions: Anterior Hip Precaution Booklet Issued: Yes (comment) Restrictions Weight Bearing Restrictions: Yes LLE Weight Bearing: Weight bearing as tolerated    Mobility  Bed Mobility Overal bed mobility: Needs Assistance Bed Mobility: Supine to Sit     Supine to sit: Supervision;HOB elevated Sit to supine: Min assist   General bed mobility comments: SBA for L LE management. Uses R bedrail.  Transfers Overall transfer level: Needs assistance Equipment used: Rolling walker (2 wheeled) Transfers: Sit to/from Stand Sit to Stand: Min guard         General transfer comment: sequencing and hand placement without direction/cues  Ambulation/Gait Ambulation/Gait assistance: Supervision;Min guard Ambulation Distance (Feet): 50 Feet Assistive device: Rolling walker (2 wheeled) Gait Pattern/deviations: Step-to pattern     General Gait Details: improved L knee flexion with swing; slow steady cadence with good safety awareness; verbal cues not to turn foot in excessively with turns.   Stairs            Wheelchair Mobility    Modified Rankin (Stroke Patients Only)       Balance  Overall balance assessment: Modified Independent                                  Cognition Arousal/Alertness: Awake/alert Behavior During Therapy: WFL for tasks assessed/performed Overall Cognitive Status: Within Functional Limits for tasks assessed                      Exercises Total Joint Exercises Ankle Circles/Pumps: Both;10 reps;AROM Heel Slides: AROM;Left;5 reps Owen Pagnotta Arc Quad: Left;AROM;Strengthening;10 reps General Exercises - Lower Extremity Quad Sets: AROM;Strengthening;Both;10 reps Gluteal Sets: AROM;Strengthening;Both;10 reps Short Arc Quad: AROM;Strengthening;Left;10 reps Heel Slides: AROM;Strengthening;Left;10 reps Hip ABduction/ADduction: AROM;Strengthening;Left;10 reps Straight Leg Raises: AROM;Strengthening;Left;10 reps    General Comments General comments (skin integrity, edema, etc.): incision covered with surgical dressing, intact      Pertinent Vitals/Pain Pain Assessment: 0-10 Pain Score: 1  Pain Location: L Hip  Pain Intervention(s): Monitored during session    Home Living Family/patient expects to be discharged to:: Private residence Living Arrangements: Spouse/significant other;Other (Comment) Available Help at Discharge: Family;Available 24 hours/day Type of Home: House Home Access: Stairs to enter Entrance Stairs-Rails: Right Home Layout: Two level;Able to live on main level with bedroom/bathroom Home Equipment: None      Prior Function Level of Independence: Independent      Comments: works at [employer]   PT Goals (current goals can now be found in the care plan section) Acute Rehab PT Goals Patient Stated Goal: "I would like to go back  home." PT Goal Formulation: With patient Time For Goal Achievement: 12/13/14 Potential to Achieve Goals: Good Progress towards PT goals: Progressing toward goals    Frequency  BID    PT Plan Current plan remains appropriate    Co-evaluation              End of Session Equipment Utilized During Treatment: Gait belt Activity Tolerance: Patient tolerated treatment well;No increased pain Patient left: in chair;with call bell/phone within reach;with family/visitor present     Time: 1610-9604 PT Time Calculation (min) (ACUTE ONLY): 27 min  Charges:  $Gait Training: 8-22 mins $Therapeutic Activity: 8-22 mins                    G Codes:      Kadeidra Coryell A Dijon Kohlman 2014-12-09, 2:08 PM

## 2014-12-06 NOTE — Plan of Care (Signed)
Problem: Respiratory: Goal: Ability to maintain adequate ventilation will improve Outcome: Progressing Pt instructed on Incentive spirometer. Tolerating without difficulty.  Problem: Pain Management: Goal: Pain level will decrease Outcome: Progressing Pt with minimal pain, pain control with oral medications this shift.  Problem: Urinary Elimination: Goal: Ability to achieve and maintain adequate urine output will improve Outcome: Progressing Foley patent and draining adequate urine.

## 2014-12-06 NOTE — Progress Notes (Signed)
Patient had foley catheter removed at 0630. Patient has voided one time, but it was not measured. Bladder scanned patient and there was in bladder. Patient does not feel the urge to urinate at this time. MD called and she ordered 1L bolus over two hours. Will administer bolus and ask patient to reattempt voiding once bolus has finished.

## 2014-12-06 NOTE — Plan of Care (Signed)
Problem: Education: Goal: Knowledge of the prescribed therapeutic regimen will improve Outcome: Completed/Met Date Met:  12/06/14 Patient successfully demonstrated proper technique when administering SQ Lovenox shot.

## 2014-12-06 NOTE — Progress Notes (Signed)
[facility] [place] [facility]   PATIENT NAME: Latoya Warren    MR#:  [id number]  DATE OF BIRTH:  1955/10/06  SUBJECTIVE:  CHIEF COMPLAINT:   Chief Complaint  Patient presents with  . Fall   the patient is 59 year old woman who presented after fall with left hip subcapital fracture with also arthritis. She underwent total hip arthroplasty via anterior approach by Dr. Rosita KeaMenz on 12/05/2014. She feels good today. Admits of having some pain on the left side. However, not significant.   Review of Systems  Constitutional: Negative for fever, chills and weight loss.  HENT: Negative for congestion.   Eyes: Negative for blurred vision and double vision.  Respiratory: Negative for cough, sputum production, shortness of breath and wheezing.   Cardiovascular: Negative for chest pain, palpitations, orthopnea, leg swelling and PND.  Gastrointestinal: Negative for nausea, vomiting, abdominal pain, diarrhea, constipation and blood in stool.  Genitourinary: Negative for dysuria, urgency, frequency and hematuria.  Musculoskeletal: Negative for falls.  Neurological: Negative for dizziness, tremors, focal weakness and headaches.  Endo/Heme/Allergies: Does not bruise/bleed easily.  Psychiatric/Behavioral: Negative for depression. The patient does not have insomnia.     VITAL SIGNS: Blood pressure 111/60, pulse 66, temperature 98.1 F (36.7 C), temperature source Oral, resp. rate 18, height 5\' 3"  (1.6 m), weight 71.033 kg (156 lb 9.6 oz), SpO2 96 %.  PHYSICAL EXAMINATION:   GENERAL:  59 y.o.-year-old patient lying in the bed with no acute distress.  EYES: Pupils equal, round, reactive to light and accommodation. No scleral icterus. Extraocular muscles intact.  HEENT: Head atraumatic, normocephalic. Oropharynx and nasopharynx clear.  NECK:  Supple, no jugular venous distention. No thyroid enlargement, no tenderness.  LUNGS: Normal breath sounds bilaterally, no wheezing,  rales,rhonchi or crepitation. No use of accessory muscles of respiration.  CARDIOVASCULAR: S1, S2 normal. No murmurs, rubs, or gallops.  ABDOMEN: Soft, nontender, nondistended. Bowel sounds present. No organomegaly or mass.  EXTREMITIES: No pedal edema, cyanosis, or clubbing. Left thigh has lateral swelling as well as bruising in the incision site area, however, no bleeding, drainage or pain on palpation was noted, no fluctuations NEUROLOGIC: Cranial nerves II through XII are intact. Muscle strength 5/5 in all extremities. Sensation intact. Gait not checked.  PSYCHIATRIC: The patient is alert and oriented x 3.  SKIN: No obvious rash, lesion, or ulcer.   ORDERS/RESULTS REVIEWED:   CBC  Recent Labs Lab 12/05/14 0908 12/06/14 0356  WBC 7.7 7.2  HGB 12.3 10.5*  HCT 36.6 30.4*  PLT 250 185  MCV 90.6 90.1  MCH 30.5 31.2  MCHC 33.6 34.7  RDW 13.8 14.0  LYMPHSABS 1.3  --   MONOABS 0.4  --   EOSABS 0.0  --   BASOSABS 0.0  --    ------------------------------------------------------------------------------------------------------------------  Chemistries   Recent Labs Lab 12/05/14 0951 12/06/14 0356  NA 141 139  K 3.4* 4.2  CL 106 109  CO2 29 26  GLUCOSE 119* 161*  BUN 12 12  CREATININE 0.62 0.70  CALCIUM 8.7* 8.0*  MG 1.9  --   AST 21  --   ALT 13*  --   ALKPHOS 48  --   BILITOT 0.9  --    ------------------------------------------------------------------------------------------------------------------ estimated creatinine clearance is 72.4 mL/min (by C-G formula based on Cr of 0.7). ------------------------------------------------------------------------------------------------------------------ No results for input(s): TSH, T4TOTAL, T3FREE, THYROIDAB in the last 72 hours.  Invalid input(s): FREET3  Cardiac Enzymes No results for input(s): CKMB, TROPONINI, MYOGLOBIN in the last  168 hours.  Invalid input(s):  CK ------------------------------------------------------------------------------------------------------------------ Invalid input(s): POCBNP ---------------------------------------------------------------------------------------------------------------  RADIOLOGY: Dg Chest 1 View  12/05/2014  CLINICAL DATA:  Pain following fall EXAM: CHEST 1 VIEW COMPARISON:  None. FINDINGS: Lungs are clear. Heart size and pulmonary vascularity are normal. No adenopathy. No pneumothorax. There is thoracolumbar levoscoliosis. No acute fracture evident. IMPRESSION: No edema or consolidation. Electronically Signed   By: Bretta Bang III M.D.   On: 12/05/2014 08:46   Dg Pelvis 1-2 Views  12/05/2014  CLINICAL DATA:  Fall onto concrete at work with hip pain, initial encounter EXAM: PELVIS - 1-2 VIEW COMPARISON:  None. FINDINGS: There is evidence of subcapital femoral neck fracture with impaction and angulation at the fracture site. The pelvic ring is intact. IMPRESSION: Subcapital left femoral neck fracture. Electronically Signed   By: Alcide Clever M.D.   On: 12/05/2014 08:45   Dg Hip Operative Unilat With Pelvis Left  12/05/2014  CLINICAL DATA:  Left hip fracture fixation. EXAM: OPERATIVE left HIP (WITH PELVIS IF PERFORMED) 1 VIEWS TECHNIQUE: Fluoroscopic spot image(s) were submitted for interpretation post-operatively. COMPARISON:  Radiographs 12/05/2014 FINDINGS: The bipolar hip prosthesis appears well seated. However, the femoral component is not completely imaged. IMPRESSION: Bipolar left hip prosthesis appears to be new in good position but the femoral component is not completely imaged. No obvious complicating features. Electronically Signed   By: Rudie Meyer M.D.   On: 12/05/2014 18:29   Dg Hip Unilat W Or W/o Pelvis 2-3 Views Left  12/05/2014  CLINICAL DATA:  Postop left hip arthroplasty. EXAM: DG HIP (WITH OR WITHOUT PELVIS) 2-3V LEFT COMPARISON:  Earlier today. FINDINGS: Examination demonstrates  placement of a left hip arthroplasty. Acetabular component is normally located. There is approximately 1 cm between the medial neck of the femoral prosthesis and adjacent trochanteric border of the femur. Skin staples are present over the soft tissues. Remainder the exam is unchanged. IMPRESSION: Left hip arthroplasty as described. Electronically Signed   By: Elberta Fortis M.D.   On: 12/05/2014 19:35   Dg Femur Port Min 2 Views Left  12/05/2014  CLINICAL DATA:  Recent fall at work onto concrete with left hip pain, initial encounter EXAM: LEFT FEMUR PORTABLE 2 VIEWS COMPARISON:  None. FINDINGS: Subcapital left femoral neck fracture is noted with impaction and angulation at the fracture site. The more distal femur is intact. No gross soft tissue abnormality is seen. IMPRESSION: Subcapital left femoral neck fracture. Electronically Signed   By: Alcide Clever M.D.   On: 12/05/2014 08:48    EKG:  Orders placed or performed during the hospital encounter of 12/05/14  . ED EKG  . ED EKG  . EKG 12-Lead  . EKG 12-Lead    ASSESSMENT AND PLAN:  Active Problems:   Closed left hip fracture (HCC) 1. Subcapital left hip fracture status post total left hip arthroplasty, anterior approach by Dr. Rosita Kea 11th of November 2016, continue pain management as well as physical therapy, as will discharge home with home health as opposed to skilled  nursing facility depending on progress 2. Hypokalemia, resolved 3. Acute posthemorrhagic anemia with about 2 g hemoglobin drop postoperatively, follow with therapy, transfuse as needed 4. Hyperglycemia. Get hemoglobin A1c 5. Essential hypertension, controlled 6. Hypothyroidism. Continue supplementation   Management plans discussed with the patient, family and they are in agreement.   DRUG ALLERGIES:  Allergies  Allergen Reactions  . Penicillins Rash    CODE STATUS:     Code Status Orders  Start     Ordered   12/05/14 1947  Full code   Continuous      12/05/14 1946      TOTAL TIME TAKING CARE OF THIS PATIENT: 40 minutes.    Katharina Caper M.D on 12/06/2014 at 3:29 PM  Between 7am to 6pm - Pager - [phone]  After 6pm go to [email or web address] - password [email or web address] [facility]  [facility]  Office  [phone]  CC: Primary care physician; Leim Fabry, MD

## 2014-12-06 NOTE — Clinical Social Work Note (Signed)
CSW consulted in the event PT had recommended short term rehab. Physical therapy has assessed patient and are making the recommendation of home with home health. CSW signing off. Reconsult CSW if needed. York SpanielMonica Shonika Kolasinski MSW,LCSW [phone]

## 2014-12-06 NOTE — Evaluation (Signed)
Occupational Therapy Evaluation Patient Details Name: Latoya Warren MRN: [id number] DOB: 01/15/1956 Today's Date: 12/06/2014    History of Present Illness Pt is a 59 yo female who fell at work and sustained a L displaced subcapital hip fracture and is now POD #1 L anterior hip replacement.   Clinical Impression   Patient completing PT when OT arrived. Received sitting EOB. Daughter present at bedside, and husband arrived during OT session. Patient was pleasant and able to participate well. Patient educated on use of AE for lower body dressing. Patient able to donn/doff socks with MIN A and vc using AE. Patient able to perform toileting transfer with MIN Guard and verbal cues for sequencing steps/hand placement.     Follow Up Recommendations  No OT follow up    Equipment Recommendations  Tub/shower seat;3 in 1 bedside comode;Other (comment) (AE for lower body dressing (hip dressing kit))    Recommendations for Other Services       Precautions / Restrictions Precautions Precautions: Anterior Hip Precaution Booklet Issued: Yes (comment) Restrictions Weight Bearing Restrictions: Yes LLE Weight Bearing: Weight bearing as tolerated      Mobility Bed Mobility Overal bed mobility: Needs Assistance Bed Mobility: Sit to Supine       Sit to supine: Min assist   General bed mobility comments: Min A managing LLE onto bed  Transfers Overall transfer level: Needs assistance Equipment used: Rolling walker (2 wheeled) Transfers: Sit to/from Stand Sit to Stand: Min guard         General transfer comment: verbal cues for sequencing and hand placement    Balance Overall balance assessment: Modified Independent                                          ADL Overall ADL's : Needs assistance/impaired             Lower Body Bathing: Moderate assistance       Lower Body Dressing: Cueing for compensatory techniques;With adaptive equipment   Toilet  Transfer: Minimal assistance;Cueing for sequencing;BSC;RW Toilet Transfer Details (indicate cue type and reason): Min cues for sequencing steps to turn and hand placement with sit/stand         Functional mobility during ADLs: Min guard;Cueing for sequencing;Rolling walker       Vision     Perception     Praxis      Pertinent Vitals/Pain Pain Assessment: 0-10 Pain Score: 1  Pain Location: L Hip  Pain Intervention(s): Premedicated before session;Monitored during session     Hand Dominance     Extremity/Trunk Assessment Upper Extremity Assessment Upper Extremity Assessment: Overall WFL for tasks assessed   Lower Extremity Assessment Lower Extremity Assessment: Defer to PT evaluation   Cervical / Trunk Assessment Cervical / Trunk Assessment: Normal   Communication Communication Communication: No difficulties   Cognition Arousal/Alertness: Awake/alert Behavior During Therapy: WFL for tasks assessed/performed Overall Cognitive Status: Within Functional Limits for tasks assessed                     General Comments       Exercises       Shoulder Instructions      Home Living Family/patient expects to be discharged to:: Private residence Living Arrangements: Spouse/significant other;Other (Comment) Available Help at Discharge: Family;Available 24 hours/day Type of Home: House Home Access: Stairs to enter [employer] of Steps:  2 Entrance Stairs-Rails: Right Home Layout: Two level;Able to live on main level with bedroom/bathroom Alternate Level Stairs-Number of Steps: flight   Bathroom Shower/Tub: Teacher, early years/pre: Standard     Home Equipment: None          Prior Functioning/Environment Level of Independence: Independent        Comments: works at [employer]    OT Diagnosis: Generalized weakness   OT Problem List: Decreased activity tolerance;Decreased knowledge of use of DME or AE   OT  Treatment/Interventions: Self-care/ADL training;Therapeutic activities;Patient/family education;DME and/or AE instruction;Balance training    OT Goals(Current goals can be found in the care plan section) Acute Rehab OT Goals Patient Stated Goal: "I would like to go back home." OT Goal Formulation: With patient/family Potential to Achieve Goals: Good  OT Frequency: Min 1X/week   Barriers to D/C:            Co-evaluation              End of Session Equipment Utilized During Treatment: Gait belt;Rolling walker;Other (comment) (AE for dressing (sock aide, reacher))  Activity Tolerance: Patient tolerated treatment well;No increased pain Patient left: in bed;with call bell/phone within reach;with bed alarm set;with family/visitor present   Time: 1000-1038 OT Time Calculation (min): 38 min Charges:  OT General Charges $OT Visit: 1 Procedure OT Evaluation $Initial OT Evaluation Tier I: 1 Procedure OT Treatments $Self Care/Home Management : 8-22 mins G-Codes:    Vernis Cabacungan L 2014/12/20, 10:58 AM Amie [place], OTR/L

## 2014-12-06 NOTE — Evaluation (Signed)
Physical Therapy Evaluation Patient Details Name: Latoya Warren MRN: [id number] DOB: 16-Feb-1955 Today's Date: 12/06/2014   History of Present Illness  Pt is a 59 yo female who fell at work and sustained a L displaced subcapital hip fracture and is now POD #1 L anterior hip replacement.  Clinical Impression  Pt presents with decreased functional mobility, strength, and gait skills following L anterior THA.  Pt is modified independent with bed mobility and min guard for sit<>stand transfers requiring education on proper sequencing and hand placement.  Pt with very good pain control and very good baseline strength.  Pt would benefit from acute PT services to address objective findings.  Rec HHPT, RW, and 3-in-1.    Follow Up Recommendations Home health PT    Equipment Recommendations  Rolling walker with 5" wheels;3in1 (PT)    Recommendations for Other Services       Precautions / Restrictions Precautions Precautions: Anterior Hip Precaution Booklet Issued: Yes (comment) Restrictions Weight Bearing Restrictions: Yes LLE Weight Bearing: Weight bearing as tolerated      Mobility  Bed Mobility Overal bed mobility: Modified Independent             General bed mobility comments: HOB elevated and R bed rail  Transfers Overall transfer level: Needs assistance Equipment used: Rolling walker (2 wheeled) Transfers: Sit to/from Stand Sit to Stand: Min guard         General transfer comment: verbal cues for sequencing and hand placement  Ambulation/Gait Ambulation/Gait assistance: Min guard Ambulation Distance (Feet): 16 Feet Assistive device: Rolling walker (2 wheeled) Gait Pattern/deviations: Step-to pattern;Decreased step length - left;Decreased stride length;Antalgic     General Gait Details: initially self-restricting L knee flexion; able to correct with verbal cues  Stairs            Wheelchair Mobility    Modified Rankin (Stroke Patients Only)        Balance Overall balance assessment: Modified Independent                                           Pertinent Vitals/Pain Pain Assessment: 0-10 Pain Score: 1  Pain Location: L hip Pain Intervention(s): Monitored during session;Premedicated before session    Home Living Family/patient expects to be discharged to:: Private residence Living Arrangements: Spouse/significant other;Other (Comment) (daughters nearby and available to assist) Available Help at Discharge: Family;Available 24 hours/day Type of Home: House Home Access: Stairs to enter Entrance Stairs-Rails: Right Entrance Stairs-Number of Steps: 2 Home Layout: Two level;Able to live on main level with bedroom/bathroom Home Equipment: None      Prior Function Level of Independence: Independent         Comments: works at [employer]        Extremity/Trunk Assessment   Upper Extremity Assessment: Overall WFL for tasks assessed           Lower Extremity Assessment: Overall WFL for tasks assessed (5/5 B ankle DF)      Cervical / Trunk Assessment: Normal  Communication   Communication: No difficulties  Cognition Arousal/Alertness: Awake/alert Behavior During Therapy: WFL for tasks assessed/performed Overall Cognitive Status: Within Functional Limits for tasks assessed                      General Comments General comments (skin integrity, edema, etc.): incision covered with surgical  dressing, intact    Exercises Total Joint Exercises Ankle Circles/Pumps: Both;10 reps;AROM Heel Slides: AROM;Left;5 reps Amaria Mundorf Arc Quad: AROM;Left;5 reps      Assessment/Plan    PT Assessment Patient needs continued PT services  PT Diagnosis Difficulty walking;Abnormality of gait;Acute pain   PT Problem List Decreased strength;Decreased range of motion;Decreased activity tolerance;Decreased mobility;Decreased knowledge of use of DME;Decreased knowledge of  precautions;Pain  PT Treatment Interventions DME instruction;Gait training;Stair training;Functional mobility training;Therapeutic activities;Therapeutic exercise;Balance training;Patient/family education   PT Goals (Current goals can be found in the Care Plan section) Acute Rehab PT Goals Patient Stated Goal: "I would like to go back home." PT Goal Formulation: With patient Time For Goal Achievement: 12/13/14 Potential to Achieve Goals: Good    Frequency BID   Barriers to discharge        Co-evaluation               End of Session Equipment Utilized During Treatment: Gait belt Activity Tolerance: Patient tolerated treatment well;No increased pain Patient left: in bed;with family/visitor present;Other (comment) (with OT) Nurse Communication: Mobility status         Time: 1610-96040945-1015 PT Time Calculation (min) (ACUTE ONLY): 30 min   Charges:   PT Evaluation $Initial PT Evaluation Tier I: 1 Procedure PT Treatments $Gait Training: 8-22 mins   PT G Codes:        Clorinda Wyble A Oracio Galen 12/06/2014, 10:30 AM

## 2014-12-07 LAB — HEMOGLOBIN A1C
Hgb A1c MFr Bld: 5.8 % (ref 4.0–6.0)
Hgb A1c MFr Bld: 6.1 % — ABNORMAL HIGH (ref 4.0–6.0)

## 2014-12-07 LAB — HEMOGLOBIN: Hemoglobin: 9.2 g/dL — ABNORMAL LOW (ref 12.0–16.0)

## 2014-12-07 NOTE — Progress Notes (Signed)
[facility] [place] at [facility]   PATIENT NAME: Latoya Needyheresa Warren    MR#:  [id number]  DATE OF BIRTH:  Dec 01, 1955  SUBJECTIVE:  CHIEF COMPLAINT:   Chief Complaint  Patient presents with  . Fall   the patient is 59 year old woman who presented after fall with left hip subcapital fracture with also arthritis. She underwent total hip arthroplasty via anterior approach by Dr. Rosita KeaMenz on 12/05/2014. She feels good today. Admits of having some pain on the left side. However, not significant. Likely to be discharged home tomorrow with home health physical therapy, per physical therapist's recommendations. Denies any discomfort today.   Review of Systems  Constitutional: Negative for fever, chills and weight loss.  HENT: Negative for congestion.   Eyes: Negative for blurred vision and double vision.  Respiratory: Negative for cough, sputum production, shortness of breath and wheezing.   Cardiovascular: Negative for chest pain, palpitations, orthopnea, leg swelling and PND.  Gastrointestinal: Negative for nausea, vomiting, abdominal pain, diarrhea, constipation and blood in stool.  Genitourinary: Negative for dysuria, urgency, frequency and hematuria.  Musculoskeletal: Negative for falls.  Neurological: Negative for dizziness, tremors, focal weakness and headaches.  Endo/Heme/Allergies: Does not bruise/bleed easily.  Psychiatric/Behavioral: Negative for depression. The patient does not have insomnia.     VITAL SIGNS: Blood pressure 110/56, pulse 80, temperature 98.6 F (37 C), temperature source Oral, resp. rate 16, height 5\' 3"  (1.6 m), weight 71.033 kg (156 lb 9.6 oz), SpO2 100 %.  PHYSICAL EXAMINATION:   GENERAL:  59 y.o.-year-old patient lying in the bed with no acute distress.  EYES: Pupils equal, round, reactive to light and accommodation. No scleral icterus. Extraocular muscles intact.  HEENT: Head atraumatic, normocephalic. Oropharynx and nasopharynx clear.   NECK:  Supple, no jugular venous distention. No thyroid enlargement, no tenderness.  LUNGS: Normal breath sounds bilaterally, no wheezing, rales,rhonchi or crepitation. No use of accessory muscles of respiration.  CARDIOVASCULAR: S1, S2 normal. No murmurs, rubs, or gallops.  ABDOMEN: Soft, nontender, nondistended. Bowel sounds present. No organomegaly or mass.  EXTREMITIES: No pedal edema, cyanosis, or clubbing. Left thigh has lateral swelling as well as bruising in the incision site area, however, no bleeding, drainage or pain on palpation was noted, no fluctuations NEUROLOGIC: Cranial nerves II through XII are intact. Muscle strength 5/5 in all extremities. Sensation intact. Gait not checked.  PSYCHIATRIC: The patient is alert and oriented x 3.  SKIN: No obvious rash, lesion, or ulcer.   ORDERS/RESULTS REVIEWED:   CBC  Recent Labs Lab 12/05/14 0908 12/06/14 0356 12/07/14 0418  WBC 7.7 7.2  --   HGB 12.3 10.5* 9.2*  HCT 36.6 30.4*  --   PLT 250 185  --   MCV 90.6 90.1  --   MCH 30.5 31.2  --   MCHC 33.6 34.7  --   RDW 13.8 14.0  --   LYMPHSABS 1.3  --   --   MONOABS 0.4  --   --   EOSABS 0.0  --   --   BASOSABS 0.0  --   --    ------------------------------------------------------------------------------------------------------------------  Chemistries   Recent Labs Lab 12/05/14 0951 12/06/14 0356  NA 141 139  K 3.4* 4.2  CL 106 109  CO2 29 26  GLUCOSE 119* 161*  BUN 12 12  CREATININE 0.62 0.70  CALCIUM 8.7* 8.0*  MG 1.9  --   AST 21  --   ALT 13*  --   ALKPHOS 48  --  BILITOT 0.9  --    ------------------------------------------------------------------------------------------------------------------ estimated creatinine clearance is 72.4 mL/min (by C-G formula based on Cr of 0.7). ------------------------------------------------------------------------------------------------------------------ No results for input(s): TSH, T4TOTAL, T3FREE, THYROIDAB in the  last 72 hours.  Invalid input(s): FREET3  Cardiac Enzymes No results for input(s): CKMB, TROPONINI, MYOGLOBIN in the last 168 hours.  Invalid input(s): CK ------------------------------------------------------------------------------------------------------------------ Invalid input(s): POCBNP ---------------------------------------------------------------------------------------------------------------  RADIOLOGY: Dg Hip Operative Unilat With Pelvis Left  12/05/2014  CLINICAL DATA:  Left hip fracture fixation. EXAM: OPERATIVE left HIP (WITH PELVIS IF PERFORMED) 1 VIEWS TECHNIQUE: Fluoroscopic spot image(s) were submitted for interpretation post-operatively. COMPARISON:  Radiographs 12/05/2014 FINDINGS: The bipolar hip prosthesis appears well seated. However, the femoral component is not completely imaged. IMPRESSION: Bipolar left hip prosthesis appears to be new in good position but the femoral component is not completely imaged. No obvious complicating features. Electronically Signed   By: Rudie Meyer M.D.   On: 12/05/2014 18:29   Dg Hip Unilat W Or W/o Pelvis 2-3 Views Left  12/05/2014  CLINICAL DATA:  Postop left hip arthroplasty. EXAM: DG HIP (WITH OR WITHOUT PELVIS) 2-3V LEFT COMPARISON:  Earlier today. FINDINGS: Examination demonstrates placement of a left hip arthroplasty. Acetabular component is normally located. There is approximately 1 cm between the medial neck of the femoral prosthesis and adjacent trochanteric border of the femur. Skin staples are present over the soft tissues. Remainder the exam is unchanged. IMPRESSION: Left hip arthroplasty as described. Electronically Signed   By: Elberta Fortis M.D.   On: 12/05/2014 19:35    EKG:  Orders placed or performed during the hospital encounter of 12/05/14  . ED EKG  . ED EKG  . EKG 12-Lead  . EKG 12-Lead    ASSESSMENT AND PLAN:  Active Problems:   Closed left hip fracture (HCC) 1. Subcapital left hip fracture status post  total left hip arthroplasty, anterior approach by Dr. Rosita Kea 11th of November 2016, continue pain management as well as physical therapy, will discharge home with home health tomorrow, likely  2. Hypokalemia, resolved 3. Acute posthemorrhagic anemia with about 3 g hemoglobin drop postoperatively, follow , no need to transfuse as of yet, iron supplementation upon discharge home 4. Hyperglycemia. Get hemoglobin A1c 5. Essential hypertension, controlled 6. Hypothyroidism. Continue supplementation   Management plans discussed with the patient, family and they are in agreement.   DRUG ALLERGIES:  Allergies  Allergen Reactions  . Penicillins Rash    CODE STATUS:     Code Status Orders        Start     Ordered   12/05/14 1947  Full code   Continuous     12/05/14 1946      TOTAL TIME TAKING CARE OF THIS PATIENT: 30 minutes.    Katharina Caper M.D on 12/07/2014 at 3:14 PM  Between 7am to 6pm - Pager - [phone]  After 6pm go to [email or web address] - password [email or web address] [facility]  [facility]  Office  [phone]  CC: Primary care physician; Leim Fabry, MD

## 2014-12-07 NOTE — Care Management Note (Signed)
Case Management Note  Patient Details  Name: Latoya Warren MRN: [id number] Date of Birth: 06-Jul-1955  Subjective/Objective:     Discussed discharge planning with [name] and her family. [name] chose [facility] as her home health PT provider from a list of area home health providers. [facility] a front wheel rolling walker and a bedside commode [name] Friday. Anticipate home with home health tomorrow.                Action/Plan:   Expected Discharge Date:                  Expected Discharge Plan:     In-House Referral:     Discharge planning Services     Post Acute Care Choice:    Choice offered to:     DME Arranged:    DME Agency:     HH Arranged:    HH Agency:     Status of Service:     Medicare Important Message Given:    Date Medicare IM Given:    Medicare IM give by:    Date Additional Medicare IM Given:    Additional Medicare Important Message give by:     If discussed at Long Length of Stay Meetings, dates discussed:    Additional Comments:  Teresea Donley A, RN 12/07/2014, 2:18 PM

## 2014-12-07 NOTE — Progress Notes (Signed)
Physical Therapy Treatment Patient Details Name: Latoya Warren MRN: [id number] DOB: 05/29/1955 Today's Date: 12/07/2014    History of Present Illness Pt is a 59 yo female who fell at work and sustained a L displaced subcapital hip fracture. Had L anterior hip replacement.    PT Comments    Patient demonstrates improved activity tolerance at today's session. Ambulated to bathroom where she had BM and performed self-care activities with supervision. Patient then was able to ambulate 180' at decreased cadence. Was able to initiate reciprocal gait pattern with verbal cues and mild increase in pain, 4/10. Patient continues to progress towards goals. Current d/c plan remains appropriate.  Follow Up Recommendations  Home health PT     Equipment Recommendations  Rolling walker with 5" wheels;3in1 (PT)    Recommendations for Other Services       Precautions / Restrictions Precautions Precautions: Anterior Hip Restrictions Weight Bearing Restrictions: Yes LLE Weight Bearing: Weight bearing as tolerated    Mobility  Bed Mobility Overal bed mobility: Needs Assistance Bed Mobility: Supine to Sit     Supine to sit: Min guard     General bed mobility comments: Patient uses R LE to assist in pushing L LE towards EOB. Patietn able to scoot independently.  Transfers Overall transfer level: Needs assistance Equipment used: Rolling walker (2 wheeled) Transfers: Sit to/from Stand Sit to Stand: Min guard         General transfer comment: Patient demonstrates good safety awareness with proper hand placement.  Ambulation/Gait Ambulation/Gait assistance: Min guard Ambulation Distance (Feet): 180 Feet Assistive device: Rolling walker (2 wheeled) Gait Pattern/deviations: Step-to pattern;Decreased step length - right;Decreased weight shift to left     General Gait Details: Patient ambulates at decreased cadence. Mild tendency to shrug shoulders and look at feet. Responded well to  postural cues. Patient able to initiate reciprocal gait pattern with verbal cues and mild increase in pain, 4/10.   Stairs            Wheelchair Mobility    Modified Rankin (Stroke Patients Only)       Balance Overall balance assessment: Modified Independent                                  Cognition Arousal/Alertness: Awake/alert Behavior During Therapy: WFL for tasks assessed/performed Overall Cognitive Status: Within Functional Limits for tasks assessed                      Exercises Total Joint Exercises Ankle Circles/Pumps: AROM;20 reps Heel Slides: AAROM;15 reps General Exercises - Lower Extremity Quad Sets: AROM;20 reps Gluteal Sets: AROM;20 reps Short Arc Quad: AAROM;15 reps Hip ABduction/ADduction: AAROM;15 reps    General Comments        Pertinent Vitals/Pain Pain Assessment: 0-10 Pain Score: 3  Pain Location: L hip Pain Descriptors / Indicators: Aching Pain Intervention(s): Limited activity within patient's tolerance;Monitored during session;Ice applied    Home Living                      Prior Function            PT Goals (current goals can now be found in the care plan section) Acute Rehab PT Goals Patient Stated Goal: "To go home." PT Goal Formulation: With patient Time For Goal Achievement: 12/13/14 Potential to Achieve Goals: Good Progress towards PT goals: Progressing toward goals  Frequency  BID    PT Plan Current plan remains appropriate    Co-evaluation             End of Session Equipment Utilized During Treatment: Gait belt Activity Tolerance: Patient tolerated treatment well Patient left: in chair;with call bell/phone within reach     Time: 0914-1000 PT Time Calculation (min) (ACUTE ONLY): 46 min  Charges:  $Gait Training: 8-22 mins $Therapeutic Exercise: 8-22 mins $Therapeutic Activity: 8-22 mins                    G Codes:      Neita CarpJulie Ann Amorie Rentz, PT, DPT 12/07/2014,  10:12 AM

## 2014-12-07 NOTE — Discharge Instructions (Signed)
Patient Name Sex DOB SSN  Latoya Warren  @GENDER @ 1955/03/13 [id number]    Discharge Planning by [facility]Skila Rollins R.   Author: Tera PartridgeWOLFE,Chasta Deshpande R. Service: Orthopedics Author Type: Physician Assistant    Filed: @T @ Note Time: 8:41 AM Status: Signed   Editor: Tera PartridgeWOLFE,Ladrea Holladay R., PA     Expand All Collapse All   ANTERIOR APPROACH TOTAL HIP REPLACEMENT POSTOPERATIVE DIRECTIONS   Hip Rehabilitation, Guidelines Following Surgery  The results of a hip operation are greatly improved after range of motion and muscle strengthening exercises. Follow all safety measures which are given to protect your hip. If any of these exercises cause increased pain or swelling in your joint, decrease the amount until you are comfortable again. Then slowly increase the exercises. Call your caregiver if you have problems or questions.   HOME CARE INSTRUCTIONS   Remove items at home which could result in a fall. This includes throw rugs or furniture in walking pathways.   ICE to the affected hip every three hours for 30 minutes at a time and then as needed for pain and swelling. Continue to use ice on the hip for pain and swelling from surgery. You may notice swelling that will progress down to the foot and ankle. This is normal after surgery. Elevate the leg when you are not up walking on it.   Continue to use the breathing machine which will help keep your temperature down. It is common for your temperature to cycle up and down following surgery, especially at night when you are not up moving around and exerting yourself. The breathing machine keeps your lungs expanded and your temperature down.  Do not place pillow under knee, focus on keeping the knee straight while resting  DIET You may resume your previous home diet once your are discharged from the hospital.  DRESSING / WOUND CARE / SHOWERING Change the surgical dressing as needed If the wound gets wet inside, change the dressing with sterile gauze.  Please use good hand washing techniques before changing the dressing. Do not use any lotions or creams on the incision until instructed by your surgeon.Keep your dressing dry with showering.  Keep your dressing dry with showering. You can keep it covered and pat dry.  STAPLE REMOVAL: Please remove staples two weeks post op and apply benzoin and 1/2 inch steri strips  ACTIVITY Walk with your walker as instructed. Use walker as long as suggested by your caregivers. Avoid periods of inactivity such as sitting longer than an hour when not asleep. This helps prevent blood clots.  You may resume a sexual relationship in one month or when given the OK by your doctor.  You may return to work once you are cleared by your doctor.  Do not drive a car for 6 weeks or until released by you surgeon.  Do not drive while taking narcotics.  WEIGHT BEARING Weight bearing as tolerated with assist device (walker, cane, etc) as directed, use it as long as suggested by your surgeon or therapist, typically at least 4-6 weeks.  POSTOPERATIVE CONSTIPATION PROTOCOL Constipation - defined medically as fewer than three stools per week and severe constipation as less than one stool per week.  One of the most common issues patients have following surgery is constipation. Even if you have a regular bowel pattern at home, your normal regimen is likely to be disrupted due to multiple reasons following surgery. Combination of anesthesia, postoperative narcotics, change in appetite and fluid intake all can affect your bowels.  In order to avoid complications following surgery, here are some recommendations in order to help you during your recovery period.  Colace (docusate) - Pick up an over-the-counter form of Colace or another stool softener and take twice a day as long as you are requiring postoperative pain medications. Take with a full glass of water daily. If you experience loose stools or diarrhea, hold the  colace until you stool forms back up. If your symptoms do not get better within 1 week or if they get worse, check with your doctor.  Dulcolax (bisacodyl) - Pick up over-the-counter and take as directed by the product packaging as needed to assist with the movement of your bowels. Take with a full glass of water. Use this product as needed if not relieved by Colace only.   MiraLax (polyethylene glycol) - Pick up over-the-counter to have on hand. MiraLax is a solution that will increase the amount of water in your bowels to assist with bowel movements. Take as directed and can mix with a glass of water, juice, soda, coffee, or tea. Take if you go more than two days without a movement. Do not use MiraLax more than once per day. Call your doctor if you are still constipated or irregular after using this medication for 7 days in a row.  If you continue to have problems with postoperative constipation, please contact the office for further assistance and recommendations. If you experience "the worst abdominal pain ever" or develop nausea or vomiting, please contact the office immediatly for further recommendations for treatment.  ITCHING If you experience itching with your medications, try taking only a single pain pill, or even half a pain pill at a time. You can also use Benadryl over the counter for itching or also to help with sleep.   TED HOSE STOCKINGS Wear the elastic stockings on both legs for 6 weeks following surgery during the day but you may remove then at night for sleeping.  MEDICATIONS See your medication summary on the After Visit Summary that the nursing staff will review with you prior to discharge. You may have some home medications which will be placed on hold until you complete the course of blood thinner medication. It is important for you to complete the blood thinner medication as prescribed by your surgeon. Continue your approved medications as instructed at time of  discharge.  PRECAUTIONS If you experience chest pain or shortness of breath - call 911 immediately for transfer to the hospital emergency department.  If you develop a fever greater that 101 F, purulent drainage from wound, increased redness or drainage from wound, foul odor from the wound/dressing, or calf pain - CONTACT YOUR SURGEON.    FOLLOW-UP APPOINTMENTS Make sure you keep all of your appointments after your operation with your surgeon and caregivers. You should call the office at the above phone number and make an appointment for approximately two weeks after the date of your surgery or on the date instructed by your surgeon outlined in the "After Visit Summary".  RANGE OF MOTION AND STRENGTHENING EXERCISES  These exercises are designed to help you keep full movement of your hip joint. Follow your caregiver's or physical therapist's instructions. Perform all exercises about fifteen times, three times per day or as directed. Exercise both hips, even if you have had only one joint replacement. These exercises can be done on a training (exercise) mat, on the floor, on a table or on a bed. Use whatever works the best and is  most comfortable for you. Use music or television while you are exercising so that the exercises are a pleasant break in your day. This will make your life better with the exercises acting as a break in routine you can look forward to.   Lying on your back, slowly slide your foot toward your buttocks, raising your knee up off the floor. Then slowly slide your foot back down until your leg is straight again.   Lying on your back spread your legs as far apart as you can without causing discomfort.   Lying on your side, raise your upper leg and foot straight up from the floor as far as is comfortable. Slowly lower the leg and repeat.   Lying on your back, tighten up the muscle in the front of your thigh (quadriceps muscles).  You can do this by keeping your leg straight and trying to raise your heel off the floor. This helps strengthen the largest muscle supporting your knee.   Lying on your back, tighten up the muscles of your buttocks both with the legs straight and with the knee bent at a comfortable angle while keeping your heel on the floor.  IF YOU ARE TRANSFERRED TO A SKILLED REHAB FACILITY If the patient is transferred to a skilled rehab facility following release from the hospital, a list of the current medications will be sent to the facility for the patient to continue. When discharged from the skilled rehab facility, please have the facility set up the patient's [place] prior to being released. Also, the skilled facility will be responsible for providing the patient with their medications at time of release from the facility to include their pain medication, the muscle relaxants, and their blood thinner medication. If the patient is still at the rehab facility at time of the two week follow up appointment, the skilled rehab facility will also need to assist the patient in arranging follow up appointment in our office and any transportation needs.  MAKE SURE YOU:   Understand these instructions.   Get help right away if you are not doing well or get worse.   Pick up stool softner and laxative for home use following surgery while on pain medications. Do not submerge incision under water. Please use good hand washing techniques while changing dressing each day. May shower starting three days after surgery. Please use a clean towel to pat the incision dry following showers. Continue to use ice for pain and swelling after surgery. Do not use any lotions or creams on the incision until instructed by your surgeon.

## 2014-12-07 NOTE — Progress Notes (Signed)
   Subjective: 2 Days Post-Op Procedure(s) (LRB): TOTAL HIP ARTHROPLASTY ANTERIOR APPROACH (Left) Patient reports pain as mild.   Patient is well, and has had no acute complaints or problems Continue with  therapy today. Little more sore today Plan is to go Home after hospital stay. no nausea and no vomiting Patient denies any chest pains or shortness of breath. Objective: Vital signs in last 24 hours: Temp:  [98.2 F (36.8 C)-98.9 F (37.2 C)] 98.6 F (37 C) (11/13 0755) Pulse Rate:  [77-96] 80 (11/13 0755) Resp:  [16-18] 16 (11/13 0755) BP: (110-120)/(56-68) 110/56 mmHg (11/13 0755) SpO2:  [98 %-100 %] 100 % (11/13 0755) well approximated incision Heels are non tender and elevated off the bed using rolled towels Intake/Output from previous day: 11/12 0701 - 11/13 0700 In: 2444.2 [P.O.:240; I.V.:1204.2; IV Piggyback:1000] Out: 2100 [Urine:2100] Intake/Output this shift:     Recent Labs  12/05/14 0908 12/06/14 0356 12/07/14 0418  HGB 12.3 10.5* 9.2*    Recent Labs  12/05/14 0908 12/06/14 0356  WBC 7.7 7.2  RBC 4.04 3.37*  HCT 36.6 30.4*  PLT 250 185    Recent Labs  12/05/14 0951 12/06/14 0356  NA 141 139  K 3.4* 4.2  CL 106 109  CO2 29 26  BUN 12 12  CREATININE 0.62 0.70  GLUCOSE 119* 161*  CALCIUM 8.7* 8.0*    Recent Labs  12/05/14 0951  INR 1.10    EXAM General - Patient is Alert, Appropriate and Oriented Extremity - Neurologically intact Neurovascular intact Sensation intact distally Intact pulses distally Dorsiflexion/Plantar flexion intact Dressing - dressing C/D/I Motor Function - intact, moving foot and toes well on exam.    Past Medical History  Diagnosis Date  . Hypertension   . Thyroid disease     Assessment/Plan: 2 Days Post-Op Procedure(s) (LRB): TOTAL HIP ARTHROPLASTY ANTERIOR APPROACH (Left) Active Problems:   Closed left hip fracture (HCC)  Estimated body mass index is 27.75 kg/(m^2) as calculated from the  following:   Height as of this encounter: 5\' 3"  (1.6 m).   Weight as of this encounter: 71.033 kg (156 lb 9.6 oz). Up with therapy Plan for discharge tomorrow Discharge home with home health  Labs: reviewed DVT Prophylaxis - Lovenox, Foot Pumps and TED hose Weight-Bearing as tolerated to left leg Needs a bowel movement today [facility] Staples to be removed at 2 weeks post op lovenox 40 mg qd for 14 days  Saxon Crosby R. [facility] PA [facility] 12/07/2014, 8:37 AM

## 2014-12-08 ENCOUNTER — Encounter: Payer: Self-pay | Admitting: Orthopedic Surgery

## 2014-12-08 DIAGNOSIS — E876 Hypokalemia: Secondary | ICD-10-CM

## 2014-12-08 DIAGNOSIS — I10 Essential (primary) hypertension: Secondary | ICD-10-CM

## 2014-12-08 DIAGNOSIS — R739 Hyperglycemia, unspecified: Secondary | ICD-10-CM

## 2014-12-08 MED ORDER — OXYCODONE HCL 5 MG PO TABS: 5.0000 mg | ORAL_TABLET | ORAL | Status: DC | PRN

## 2014-12-08 MED ORDER — OXYCODONE HCL 5 MG PO TABS: 5.0000 mg | ORAL_TABLET | ORAL | Status: AC | PRN

## 2014-12-08 MED ORDER — BISACODYL 10 MG RE SUPP: 10.0000 mg | Freq: Every day | RECTAL | Status: AC | PRN

## 2014-12-08 MED ORDER — DOCUSATE SODIUM 100 MG PO CAPS: 100.0000 mg | ORAL_CAPSULE | Freq: Two times a day (BID) | ORAL | Status: AC

## 2014-12-08 MED ORDER — ENOXAPARIN SODIUM 40 MG/0.4ML ~~LOC~~ SOLN: 40.0000 mg | SUBCUTANEOUS | Status: AC

## 2014-12-08 NOTE — Progress Notes (Signed)
  Subjective: 3 Days Post-Op Procedure(s) (LRB): TOTAL HIP ARTHROPLASTY ANTERIOR APPROACH (Left) Patient reports pain as mild.   Patient is well, and has had no acute complaints or problems Plan is to go home with homehealthcare PT after hospital stay. Negative for chest pain and shortness of breath Fever: no Gastrointestinal:Negative for nausea and vomiting  Objective: Vital signs in last 24 hours: Temp:  [98.5 F (36.9 C)-99.2 F (37.3 C)] 99.2 F (37.3 C) (11/14 0610) Pulse Rate:  [80-95] 95 (11/14 0610) Resp:  [16-18] 18 (11/14 0610) BP: (99-136)/(54-72) 123/72 mmHg (11/14 0610) SpO2:  [99 %-100 %] 99 % (11/14 0610)  Intake/Output from previous day:  Intake/Output Summary (Last 24 hours) at 12/08/14 0752 Last data filed at 12/08/14 0420  Gross per 24 hour  Intake    720 ml  Output    600 ml  Net    120 ml    Intake/Output this shift:    Labs:  Recent Labs  12/05/14 0908 12/06/14 0356 12/07/14 0418  HGB 12.3 10.5* 9.2*    Recent Labs  12/05/14 0908 12/06/14 0356  WBC 7.7 7.2  RBC 4.04 3.37*  HCT 36.6 30.4*  PLT 250 185    Recent Labs  12/05/14 0951 12/06/14 0356  NA 141 139  K 3.4* 4.2  CL 106 109  CO2 29 26  BUN 12 12  CREATININE 0.62 0.70  GLUCOSE 119* 161*  CALCIUM 8.7* 8.0*    Recent Labs  12/05/14 0951  INR 1.10     EXAM General - Patient is Alert, Appropriate and Oriented Extremity - Neurologically intact ABD soft Neurovascular intact Sensation intact distally Intact pulses distally Dorsiflexion/Plantar flexion intact Incision: dressing C/D/I No cellulitis present Dressing/Incision - clean, dry, no drainage Motor Function - intact, moving foot and toes well on exam.  Past Medical History  Diagnosis Date  . Hypertension   . Thyroid disease     Assessment/Plan: 3 Days Post-Op Procedure(s) (LRB): TOTAL HIP ARTHROPLASTY ANTERIOR APPROACH (Left) Active Problems:   Closed left hip fracture (HCC)  Estimated body mass  index is 27.75 kg/(m^2) as calculated from the following:   Height as of this encounter: 5\' 3"  (1.6 m).   Weight as of this encounter: 71.033 kg (156 lb 9.6 oz). Advance diet Up with therapy  Pt is stable with no complaints.  Labs reviewed, Hg 9.2 this AM. Pt has had a bowel movement and is urinating without the foley. Pt will be discharged with home healthcare PT.   Pt fell at work, this is a workers Youth workercompensation case.  Care Management to assist with discharge. Can discharge with medically appropriate.  DVT Prophylaxis - Lovenox, Foot Pumps and TED hose Weight-Bearing as tolerated to left leg  J. Horris LatinoLance McGhee, PA-C [facility] 12/08/2014, 7:52 AM

## 2014-12-08 NOTE — Progress Notes (Signed)
Physical Therapy Treatment Patient Details Name: Latoya Warren MRN: [id number] DOB: Nov 20, 1955 Today's Date: 12/08/2014    History of Present Illness Pt is a 59 yo female who fell at work and sustained a L displaced subcapital hip fracture. Had L anterior hip replacement.    PT Comments    Pt instructed/reviewed car transfer to methodology to maintain hip precautions.  Instructed/reviewed stair training, pt able to ascend/decend x4 steps using L rail CGA with step to pattern and good safety.  Pt followed up with ambulating 13520ft with initially step to pattern improving to step through after a short distance.    Follow Up Recommendations  Home health PT     Equipment Recommendations  Rolling walker with 5" wheels    Recommendations for Other Services       Precautions / Restrictions Precautions Precautions: Anterior Hip Restrictions Weight Bearing Restrictions: Yes LLE Weight Bearing: Weight bearing as tolerated    Mobility  Bed Mobility               General bed mobility comments: Pt in presents in chair  Transfers Overall transfer level: Needs assistance Equipment used: Rolling walker (2 wheeled) Transfers: Sit to/from Stand Sit to Stand: Min guard         General transfer comment: Pt demostrated good safety with transfers   Ambulation/Gait Ambulation/Gait assistance: Modified independent (Device/Increase time) Ambulation Distance (Feet): 120 Feet Assistive device: Rolling walker (2 wheeled) Gait Pattern/deviations: Step-to pattern;Step-through pattern;Decreased step length - left;Decreased stance time - left     General Gait Details: initially step to pattern with antalgic gait noted, as progressed pt improved to step through patter with increased cadence   Stairs Stairs: Yes Stairs assistance: Min guard Stair Management: One rail Left;Step to pattern Number of Stairs: 4 General stair comments: Instructed in stair use with good  carryover  Wheelchair Mobility    Modified Rankin (Stroke Patients Only)       Balance Overall balance assessment: Modified Independent                                  Cognition Arousal/Alertness: Awake/alert Behavior During Therapy: WFL for tasks assessed/performed Overall Cognitive Status: Within Functional Limits for tasks assessed                      Exercises      General Comments        Pertinent Vitals/Pain Pain Assessment: 0-10 Pain Score: 3  Pain Location: L hip Pain Descriptors / Indicators: Aching;Constant Pain Intervention(s): Limited activity within patient's tolerance;Monitored during session    Home Living                      Prior Function            PT Goals (current goals can now be found in the care plan section) Acute Rehab PT Goals PT Goal Formulation: With patient Time For Goal Achievement: 12/13/14 Potential to Achieve Goals: Good Progress towards PT goals: Progressing toward goals    Frequency  BID    PT Plan Current plan remains appropriate    Co-evaluation             End of Session Equipment Utilized During Treatment: Gait belt Activity Tolerance: Patient tolerated treatment well Patient left: in chair;with call bell/phone within reach;with chair alarm set     Time: [id number] PT Time Calculation (  min) (ACUTE ONLY): 31 min  Charges:  $Gait Training: 8-22 mins $Therapeutic Activity: 8-22 mins                    G Codes:      Danette Weinfeld 12/19/2014, 12:42 PM Tra Wilemon, PTA

## 2014-12-08 NOTE — Discharge Summary (Signed)
[facility] - [place] [facility]   PATIENT NAME: Latoya Warren    MR#:  [id number]  DATE OF BIRTH:  [birth year]  DATE OF ADMISSION:  12/05/2014 ADMITTING PHYSICIAN: Shaune Pollack, MD  DATE OF DISCHARGE: 12/08/2014  2:00 PM  PRIMARY CARE PHYSICIAN: Leim Fabry, MD     ADMISSION DIAGNOSIS:  Fall [W19.XXXA] Femoral neck fracture, left, closed, initial encounter [S72.002A]  DISCHARGE DIAGNOSIS:  Active Problems:   Closed left hip fracture (HCC)   Essential hypertension   Hypokalemia   Hyperglycemia   SECONDARY DIAGNOSIS:   Past Medical History  Diagnosis Date  . Hypertension   . Thyroid disease     .pro HOSPITAL COURSE:   Patient is a 59 year old female with past medical history significant for history of essential hypertension who presents to the hospital after fall with left hip subcapital fracture. She underwent total hip arthroplasty via anterior approach by Dr. Rosita Kea on 12/05/2014. She feels good today. Patient was seen by physical therapist and recommended home health physical therapy  Discussion by problem  1. Subcapital left hip fracture status post total left hip arthroplasty, anterior approach by Dr. Rosita Kea 11th of November 2016, continue pain management as well as physical therapy at home, she is to follow-up with Dr. Rosita Kea in the next few weeks after discharge  2. Hypokalemia, resolved 3. Acute posthemorrhagic anemia with about 3 g hemoglobin drop postoperatively, follow as outpatient , no indication to transfuse , iron supplementation was advised upon discharge home 4. Hyperglycemia. hemoglobin A1c 6.1, no diabetes 5. Essential hypertension, controlled 6. Hypothyroidism. Continue supplementation DISCHARGE CONDITIONS:   Stable  CONSULTS OBTAINED:  Treatment Team:  Kennedy Bucker, MD  DRUG ALLERGIES:   Allergies  Allergen Reactions  . Penicillins Rash    DISCHARGE MEDICATIONS:   Discharge Medication List as of 12/08/2014  10:15 AM    START taking these medications   Details  bisacodyl (DULCOLAX) 10 MG suppository Place 1 suppository (10 mg total) rectally daily as needed for moderate constipation., Starting 12/08/2014, Until Discontinued, Normal    docusate sodium (COLACE) 100 MG capsule Take 1 capsule (100 mg total) by mouth 2 (two) times daily., Starting 12/08/2014, Until Discontinued, Normal    enoxaparin (LOVENOX) 40 MG/0.4ML injection Inject 0.4 mLs (40 mg total) into the skin daily., Starting 12/08/2014, Until Discontinued, Print      CONTINUE these medications which have CHANGED   Details  oxyCODONE (OXY IR/ROXICODONE) 5 MG immediate release tablet Take 1-2 tablets (5-10 mg total) by mouth every 4 (four) hours as needed for breakthrough pain., Starting 12/08/2014, Until Discontinued, Print      CONTINUE these medications which have NOT CHANGED   Details  acetaminophen (RA ACETAMINOPHEN) 650 MG CR tablet Take 650 mg by mouth every 8 (eight) hours as needed for pain. , Until Discontinued, Historical Med    hydrochlorothiazide (HYDRODIURIL) 25 MG tablet Take 25 mg by mouth daily., Until Discontinued, Historical Med    ipratropium (ATROVENT) 0.06 % nasal spray Place 2 sprays into both nostrils 3 (three) times daily. For 14 days, Starting 12/02/2014, Until Discontinued, Historical Med    !! levothyroxine (SYNTHROID, LEVOTHROID) 75 MCG tablet Take 75 mcg by mouth daily. Except on Tuesday and Thursday, Until Discontinued, Historical Med    !! levothyroxine (SYNTHROID, LEVOTHROID) 88 MCG tablet Take 88 mcg by mouth daily. On Tuesday and Thursday, Until Discontinued, Historical Med    Multiple Vitamins-Minerals (MULTIVITAMIN WITH MINERALS) tablet Take 1 tablet by mouth daily., Until Discontinued, Historical Med     !! -  Potential duplicate medications found. Please discuss with provider.       DISCHARGE INSTRUCTIONS:    Patient is to follow-up with primary care physician, Dr. Nilda RiggsAldrich and orthopedist  surgeon in the next few weeks after discharge  If you experience worsening of your admission symptoms, develop shortness of breath, life threatening emergency, suicidal or homicidal thoughts you must seek medical attention immediately by calling 911 or calling your MD immediately  if symptoms less severe.  You Must read complete instructions/literature along with all the possible adverse reactions/side effects for all the Medicines you take and that have been prescribed to you. Take any new Medicines after you have completely understood and accept all the possible adverse reactions/side effects.   Please note  You were cared for by a hospitalist during your hospital stay. If you have any questions about your discharge medications or the care you received while you were in the hospital after you are discharged, you can call the unit and asked to speak with the hospitalist on call if the hospitalist that took care of you is not available. Once you are discharged, your primary care physician will handle any further medical issues. Please note that NO REFILLS for any discharge medications will be authorized once you are discharged, as it is imperative that you return to your primary care physician (or establish a relationship with a primary care physician if you do not have one) for your aftercare needs so that they can reassess your need for medications and monitor your lab values.    Today   CHIEF COMPLAINT:   Chief Complaint  Patient presents with  . Fall    HISTORY OF PRESENT ILLNESS:  Latoya Needyheresa Warren  is a 59 y.o. female with a known history of  essential hypertension who presents to the hospital after fall with left hip subcapital fracture. She underwent total hip arthroplasty via anterior approach by Dr. Rosita KeaMenz on 12/05/2014. She feels good today. Patient was seen by physical therapist and recommended home health physical therapy  Discussion by problem  1. Subcapital left hip fracture status  post total left hip arthroplasty, anterior approach by Dr. Rosita KeaMenz 11th of November 2016, continue pain management as well as physical therapy at home, she is to follow-up with Dr. Rosita KeaMenz in the next few weeks after discharge  2. Hypokalemia, resolved 3. Acute posthemorrhagic anemia with about 3 g hemoglobin drop postoperatively, follow as outpatient , no indication to transfuse , iron supplementation was advised upon discharge home 4. Hyperglycemia. hemoglobin A1c 6.1, no diabetes 5. Essential hypertension, controlled 6. Hypothyroidism. Continue supplementation    VITAL SIGNS:  Blood pressure 135/71, pulse 89, temperature 99.1 F (37.3 C), temperature source Oral, resp. rate 18, height 5\' 3"  (1.6 m), weight 71.033 kg (156 lb 9.6 oz), SpO2 99 %.  I/O:   Intake/Output Summary (Last 24 hours) at 12/08/14 1704 Last data filed at 12/08/14 [id number]  Gross per 24 hour  Intake    480 ml  Output    300 ml  Net    180 ml    PHYSICAL EXAMINATION:  GENERAL:  59 y.o.-year-old patient lying in the bed with no acute distress.  EYES: Pupils equal, round, reactive to light and accommodation. No scleral icterus. Extraocular muscles intact.  HEENT: Head atraumatic, normocephalic. Oropharynx and nasopharynx clear.  NECK:  Supple, no jugular venous distention. No thyroid enlargement, no tenderness.  LUNGS: Normal breath sounds bilaterally, no wheezing, rales,rhonchi or crepitation. No use of accessory muscles of respiration.  CARDIOVASCULAR: S1, S2 normal. No murmurs, rubs, or gallops.  ABDOMEN: Soft, non-tender, non-distended. Bowel sounds present. No organomegaly or mass.  EXTREMITIES: No pedal edema, cyanosis, or clubbing.  NEUROLOGIC: Cranial nerves II through XII are intact. Muscle strength 5/5 in all extremities. Sensation intact. Gait not checked.  PSYCHIATRIC: The patient is alert and oriented x 3.  SKIN: No obvious rash, lesion, or ulcer.   DATA REVIEW:   CBC  Recent Labs Lab 12/06/14 0356  12/07/14 0418  WBC 7.2  --   HGB 10.5* 9.2*  HCT 30.4*  --   PLT 185  --     Chemistries   Recent Labs Lab 12/05/14 0951 12/06/14 0356  NA 141 139  K 3.4* 4.2  CL 106 109  CO2 29 26  GLUCOSE 119* 161*  BUN 12 12  CREATININE 0.62 0.70  CALCIUM 8.7* 8.0*  MG 1.9  --   AST 21  --   ALT 13*  --   ALKPHOS 48  --   BILITOT 0.9  --     Cardiac Enzymes No results for input(s): TROPONINI in the last 168 hours.  Microbiology Results  Results for orders placed or performed during the hospital encounter of 12/05/14  Surgical pcr screen     Status: None   Collection Time: 12/05/14 11:07 AM  Result Value Ref Range Status   MRSA, PCR NEGATIVE NEGATIVE Final   Staphylococcus aureus NEGATIVE NEGATIVE Final    Comment:        The Xpert SA Assay (FDA approved for NASAL specimens in patients over 50 years of age), is one component of a comprehensive surveillance program.  Test performance has been validated by [facility] for patients greater than or equal to 48 year old. It is not intended to diagnose infection nor to guide or monitor treatment.     RADIOLOGY:  No results found.  EKG:   Orders placed or performed during the hospital encounter of 12/05/14  . ED EKG  . ED EKG  . EKG 12-Lead  . EKG 12-Lead      Management plans discussed with the patient, family and they are in agreement.  CODE STATUS:     Code Status Orders        Start     Ordered   12/05/14 1947  Full code   Continuous     12/05/14 1946      TOTAL TIME TAKING CARE OF THIS PATIENT: 40  minutes.    Katharina Caper M.D on 12/08/2014 at 5:04 PM  Between 7am to 6pm - Pager - [phone]  After 6pm go to [email or web address] - password [email or web address] [facility]  [facility]  Office  [phone]  CC: Primary care physician; Leim Fabry, MD

## 2014-12-08 NOTE — Care Management Note (Signed)
Case Management Note  Patient Details  Name: Latoya Warren MRN: [id number] Date of Birth: 02-01-1955  Subjective/Objective:  [name] chose [facility] to be her home health PT provider from a list of area providers. A referral was called to Feliberto GottronJason Hinton at [facility] advising him of this referral.                    Action/Plan:   Expected Discharge Date:                  Expected Discharge Plan:     In-House Referral:     Discharge planning Services     Post Acute Care Choice:    Choice offered to:     DME Arranged:    DME Agency:     HH Arranged:    HH Agency:     Status of Service:     Medicare Important Message Given:    Date Medicare IM Given:    Medicare IM give by:    Date Additional Medicare IM Given:    Additional Medicare Important Message give by:     If discussed at Long Length of Stay Meetings, dates discussed:    Additional Comments:  Clydie Dillen A, RN 12/08/2014, 10:16 AM

## 2014-12-09 NOTE — Anesthesia Postprocedure Evaluation (Signed)
  Anesthesia Post-op Note  Patient: Latoya Warren  Procedure(s) Performed: Procedure(s): TOTAL HIP ARTHROPLASTY ANTERIOR APPROACH (Left)  Anesthesia type:General ETT  Patient location: PACU  Post pain: Pain level controlled  Post assessment: Post-op Vital signs reviewed, Patient's Cardiovascular Status Stable, Respiratory Function Stable, Patent Airway and No signs of Nausea or vomiting  Post vital signs: Reviewed and stable  Last Vitals:  Filed Vitals:   12/08/14 0755  BP: 135/71  Pulse: 89  Temp: 37.3 C  Resp: 18    Level of consciousness: awake, alert  and patient cooperative  Complications: No apparent anesthesia complications

## 2014-12-10 LAB — SURGICAL PATHOLOGY

## 2015-02-23 ENCOUNTER — Ambulatory Visit: Payer: Worker's Compensation | Attending: Orthopedic Surgery

## 2015-02-23 DIAGNOSIS — R262 Difficulty in walking, not elsewhere classified: Secondary | ICD-10-CM | POA: Diagnosis present

## 2015-02-23 DIAGNOSIS — R531 Weakness: Secondary | ICD-10-CM | POA: Diagnosis present

## 2015-02-23 DIAGNOSIS — Z4789 Encounter for other orthopedic aftercare: Secondary | ICD-10-CM | POA: Insufficient documentation

## 2015-02-23 DIAGNOSIS — M25552 Pain in left hip: Secondary | ICD-10-CM | POA: Diagnosis present

## 2015-02-23 NOTE — Therapy (Signed)
[facility] [address] [place], [address] Phone: [phone]   Fax:  [phone]  Physical Therapy Evaluation  Patient Details  Name: Latoya Warren MRN: [id number] Date of Birth: March 23, 1956 Referring Provider: Kennedy Bucker, MD  Encounter Date: 02/23/2015      PT End of Session - 02/23/15 1522    Visit Number 1   Number of Visits 9   Date for PT Re-Evaluation 03/26/15   PT Start Time 1522   PT Stop Time 1626   PT Time Calculation (min) 64 min   Activity Tolerance Patient tolerated treatment well   Behavior During Therapy [facility] for tasks assessed/performed      Past Medical History  Diagnosis Date  . Hypertension   . Thyroid disease     Past Surgical History  Procedure Laterality Date  . 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  Visit Diagnosis:  Orthopedic aftercare - Plan: PT plan of care cert/re-cert  Difficulty walking - Plan: PT plan of care cert/re-cert  Weakness - Plan: PT plan of care cert/re-cert  Hip pain, left - Plan: PT plan of care cert/re-cert      Subjective Assessment - 02/23/15 1524    Subjective L hip pain: 2/10 currently ( pt sitting position), 0/10 at best (not being busy, sitting), 6/10 (end of December 2016, pt returned to work, but getting better)   Pertinent History S/P L THA on December 05, 2014 (S/P 11 weeks) secondary to a fall. Pt was trying to pull something at work, the handle was not attached, and patient fell. Had inpatient and  home health physical therapy for 6 weeks. Pt started with a rw for 4 weeks, and started using her SPC 6 weeks post op. 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   Currenlty has  difficulty with standing up from a toilet seat (low sufaces), reaching down to the ground, putting on socks and shoes, walking, stair negotiation (12 steps to get to her second floor bedroom), getting out of bed.  Pt able to cook and wash dishes.  Pt also states feeling tightness L anterior hip. L leg also feels restless when lying on her back. Pt states that she is walking more without the cane.    Patient Stated Goals Be better able to put shoes on, reach the bottom cabinets, carry her granson.     Currently in Pain? Yes   Pain Score 2    Pain Location Hip   Pain Orientation Left   Pain Descriptors / Indicators Aching   Pain Type Surgical pain   Aggravating Factors  standing up from low surfaces, reaching down to the ground, donning and doffing socks and shoes, walking, stair negotiation, getting out of bed   Multiple Pain Sites No     Objectives:  Manual therapy: Soft tissue mobilization to L rectus femoris and TFL muscle (decreased L hip tightness, decreased difficulty with gait)  There-ex: S/L L hip abduction with PT assist 10x2 Supine glute max squeeze 10x5 seconds for 2 sets. Reviewed and given as part of her HEP. Pt demonstrated and verbalized understanding.   Improved exercise technique, movement at target joints, use of target muscles  after mod verbal, visual, tactile cues.           [facility] PT Assessment - 02/23/15 1549    Assessment   Medical Diagnosis S/P L THA on 12/05/2014  anterior approach   Referring Provider Kennedy Bucker, MD   Onset Date/Surgical Date 12/05/14   Next MD Visit 04/13/2015   Prior Therapy Pt participated in both inpatient and home health physical therapy.    Precautions   Precaution Comments No twisting, no ER, no crossing legs, no hip extension   Restrictions   Other Position/Activity Restrictions WBAT   Balance Screen   Has the patient fallen in the past 6 months Yes   How many times? 1  Pt was pulling something at work. Handle was not attached    Has the patient had a decrease in activity level because of a fear of falling?  No  decreased activity level secondary to hip surgery   Is the patient reluctant to leave their home because of a fear of falling?  No  Pt does state having a fear of falling.    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   Vocation Requirements PLOF: better able to walk, negotiate stairs, don and doff socks and shoes   Observation/Other Assessments   Observations sit <> stand: decreased L femoral control    Lower Extremity Functional Scale  43/80   Posture/Postural Control   Posture Comments R lateral lean, decreased stance L LE, L knee bent, decreased hip extension   Strength   Right Hip Flexion 4-/5   Left Hip Flexion 3+/5   Left Hip ABduction 4-/5  with a slight pinch sensation L hip   Right Knee Flexion 4/5   Right Knee Extension 4+/5   Left Knee Flexion 4/5   Left Knee Extension 4+/5   Palpation   Palpation comment TTP L lateral hip, rectus femors. Tight rectus femoris and TFL muscles L anterior hip.    Ambulation/Gait   Gait Comments without SPC: antalgic, decreased stance L LE, L posterior pelvic rotation                           PT Education - 02/23/15 1856    Education provided Yes   Education Details ther-ex, HEP   Person(s) Educated Patient   Methods Explanation;Demonstration;Tactile cues;Verbal cues;Handout   Comprehension Verbalized understanding;Returned demonstration             PT Long Term Goals - 02/23/15 1649    PT LONG TERM GOAL #1   Title Patient will improve L hip strength by at least 1/2 MMT grade to promote ability to ambulate, and negotiate stairs.    Time 4   Period Weeks   Status New   PT LONG TERM GOAL #2   Title Patient will improve her LEFS score by at least 9 points as a demonstration of improved function.    Time  4   Period Weeks   Status New   PT LONG TERM GOAL #3   Title Patient will have a decrease in L hip pain to 2/10 or less at worst to improve ability to ambulate.    Time 4   Period Weeks   Status New               Plan - 02/23/15 8119  Clinical Impression Statement Patient is a 60 year old female who came to physical therapy S/P L THA on 12/05/2014 secondary to a fall. She also presents with L hip discomfort, weakness, tight anterior hip muscles, altered gait pattern and posture, TTP L hip, and difficulty performing functional tasks such as walking, donning and doffing socks, performing sit <>stand transfers (especially lower surfaces such as the toilet seat), picking up items from the floor, and stair negotiation. Patient will benefit from skilled physical therapy services to address the aforementioned deficits.    Pt will benefit from skilled therapeutic intervention in order to improve on the following deficits Abnormal gait;Pain;Difficulty walking;Decreased strength;Improper body mechanics   Rehab Potential Good   Clinical Impairments Affecting Rehab Potential No known clinical impairments affecting rehab potential.    PT Frequency 2x / week   PT Duration 4 weeks   PT Treatment/Interventions Therapeutic activities;Manual techniques;Therapeutic exercise;Aquatic Therapy;Gait training;Scar mobilization;Patient/family education;Ultrasound   PT Next Visit Plan Soft tissue mobilization to L hip muscles, glute med and max strengthening, gait, functional strengthening   Consulted and Agree with Plan of Care Patient         Problem List Patient Active Problem List   Diagnosis Date Noted  . Hypokalemia 12/08/2014  . Hyperglycemia 12/08/2014  . Essential hypertension 12/08/2014  . Closed left hip fracture (HCC) 12/05/2014   Thank you for your referral.  Loralyn Freshwater PT, DPT   02/23/2015, 7:19 PM  [facility] [address], [place], [address] Phone: [phone]   Fax:  [phone]  Name: SALVATRICE MORANDI MRN: [id number] Date of Birth: 1955-11-27

## 2015-02-23 NOTE — Patient Instructions (Signed)
  On your back, squeeze rear end muscles together. Hold for 5 seconds, repeat 10 times, perform 3 sets daily.

## 2015-02-26 ENCOUNTER — Ambulatory Visit: Payer: BLUE CROSS/BLUE SHIELD | Attending: Orthopedic Surgery

## 2015-02-26 DIAGNOSIS — M25552 Pain in left hip: Secondary | ICD-10-CM | POA: Diagnosis present

## 2015-02-26 DIAGNOSIS — R262 Difficulty in walking, not elsewhere classified: Secondary | ICD-10-CM | POA: Diagnosis present

## 2015-02-26 DIAGNOSIS — Z4789 Encounter for other orthopedic aftercare: Secondary | ICD-10-CM | POA: Diagnosis not present

## 2015-02-26 DIAGNOSIS — R531 Weakness: Secondary | ICD-10-CM | POA: Diagnosis present

## 2015-02-26 NOTE — Patient Instructions (Addendum)
Gave standing mini squats with bilateral UE assist as part of her HEP 10x3 daily. Pt demonstrated and verbalized understanding.

## 2015-02-26 NOTE — Therapy (Signed)
[place] [facility] [address], [place], [address] Phone: [phone]   Fax:  [phone]  Physical Therapy Treatment  Patient Details  Name: Latoya Warren MRN: [id number] Date of Birth: [birth year] Referring Provider: Kennedy Bucker, MD  Encounter Date: 02/26/2015      PT End of Session - 02/26/15 1726    Visit Number 2   Number of Visits 9   Date for PT Re-Evaluation 03/26/15   PT Start Time 1726   PT Stop Time 1815   PT Time Calculation (min) 49 min   Activity Tolerance Patient tolerated treatment well   Behavior During Therapy [facility] for tasks assessed/performed      Past Medical History  Diagnosis Date  . Hypertension   . Thyroid disease     Past Surgical History  Procedure Laterality Date  . 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  Visit Diagnosis:  Orthopedic aftercare  Difficulty walking  Weakness  Hip pain, left      Subjective Assessment - 02/26/15 1724    Subjective L hip felt better after last session. 3/10 currently from work.    Pertinent History S/P L THA on December 05, 2014 (S/P 11 weeks) secondary to a fall. Pt was trying to pull something at work, the handle was not attached, and patient fell. Had inpatient and  home health physical therapy for 6 weeks. Pt started with a rw for 4 weeks, and started using her SPC 6 weeks post op. 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   Currenlty has difficulty with standing up from a toilet seat (low sufaces), reaching down to the ground, putting on socks and shoes, walking, stair negotiation (12 steps to get to her second floor bedroom), getting out of bed.  Pt able to cook and wash dishes.   Pt also states feeling tightness L anterior hip. L leg also feels restless when lying on her back. Pt states that she is walking more without the cane.    Patient Stated Goals Be better able to put shoes on, reach the bottom cabinets, carry her granson.     Currently in Pain? Yes   Pain Score 3    Multiple Pain Sites No          Objectives:  Manual therapy: Soft tissue mobilization to L rectus femoris and TFL muscle   There-ex:  Supine L hip march 10x3 (not past 90 degrees) S/L L hip abduction with PT assist 10x2  Sit <> stand from elevated mat table 10x Standing mini squats with bilateral UE assist 10x3, cues for center of gravity over base of support.     Given as part of her HEP. Pt demonstrated and verbalized understanding.  Gait without SPC around the gym 100 ft x 2, cues for increasing L hip flexion to promote better foot clearance Forward wedding march 32 ft x 4 without SPC Side stepping 32 ft x 2 each direction without SPC   Improved exercise technique, movement at target joints, use of target muscles after mod verbal, visual, tactile cues.    Slight increase in L hip discomfort after manual therapy. No L hip pain or discomfort after exercises. Pt  able to ambulate without use of AD without LOB. Pt was recommended to continue practicing walking without using her [facility] but to keep it with her just in case she needs to use it. Pt verbalized understanding.                      PT Education - 02/26/15 1743    Education provided Yes   Education Details ther-ex   Person(s) Educated Patient   Methods Explanation   Comprehension Verbalized understanding;Returned demonstration             PT Long Term Goals - 02/23/15 1649    PT LONG TERM GOAL #1   Title Patient will improve L hip strength by at least 1/2 MMT grade to promote ability to ambulate, and negotiate stairs.    Time 4   Period Weeks   Status New   PT LONG TERM GOAL #2   Title Patient will  improve her LEFS score by at least 9 points as a demonstration of improved function.    Time 4   Period Weeks   Status New   PT LONG TERM GOAL #3   Title Patient will have a decrease in L hip pain to 2/10 or less at worst to improve ability to ambulate.    Time 4   Period Weeks   Status New               Plan - 02/26/15 1724    Clinical Impression Statement Slight increase in L hip discomfort after manual therapy. No L hip pain or discomfort after exercises. Pt able to ambulate without use of AD without LOB. Pt was recommended to continue practicing walking without using her [facility] but to keep it with her just in case she needs to use it. Pt verbalized understanding.    Pt will benefit from skilled therapeutic intervention in order to improve on the following deficits Abnormal gait;Pain;Difficulty walking;Decreased strength;Improper body mechanics   Rehab Potential Good   Clinical Impairments Affecting Rehab Potential No known clinical impairments affecting rehab potential.    PT Frequency 2x / week   PT Duration 4 weeks   PT Treatment/Interventions Therapeutic activities;Manual techniques;Therapeutic exercise;Aquatic Therapy;Gait training;Scar mobilization;Patient/family education;Ultrasound   PT Next Visit Plan Soft tissue mobilization to L hip muscles, glute med and max strengthening, gait, functional strengthening   Consulted and Agree with Plan of Care Patient        Problem List Patient Active Problem List   Diagnosis Date Noted  . Hypokalemia 12/08/2014  . Hyperglycemia 12/08/2014  . Essential hypertension 12/08/2014  . Closed left hip fracture (HCC) 12/05/2014    Loralyn Freshwater PT, DPT   02/26/2015, 6:31 PM  [facility] SPORTS MEDICINE [address] [place], [address] Phone: [phone]   Fax:  [phone]  Name: Latoya Warren MRN: [id number] Date of Birth: 1955/12/11

## 2015-03-02 ENCOUNTER — Ambulatory Visit: Payer: BLUE CROSS/BLUE SHIELD

## 2015-03-02 DIAGNOSIS — R262 Difficulty in walking, not elsewhere classified: Secondary | ICD-10-CM

## 2015-03-02 DIAGNOSIS — R531 Weakness: Secondary | ICD-10-CM

## 2015-03-02 DIAGNOSIS — Z4789 Encounter for other orthopedic aftercare: Secondary | ICD-10-CM | POA: Diagnosis not present

## 2015-03-02 DIAGNOSIS — M25552 Pain in left hip: Secondary | ICD-10-CM

## 2015-03-02 NOTE — Therapy (Signed)
[facility] [facility] [address] [place], [address] Phone: [phone]   Fax:  [phone]  Physical Therapy Treatment  Patient Details  Name: Latoya Warren MRN: [id number] Date of Birth: 01-06-1956 Referring Provider: Kennedy Bucker, MD  Encounter Date: 03/02/2015      PT End of Session - 03/02/15 1614    Visit Number 3   Number of Visits 9   Date for PT Re-Evaluation 03/26/15   PT Start Time 1615   PT Stop Time 1659   PT Time Calculation (min) 44 min   Activity Tolerance Patient tolerated treatment well   Behavior During Therapy [facility] for tasks assessed/performed      Past Medical History  Diagnosis Date  . Hypertension   . Thyroid disease     Past Surgical History  Procedure Laterality Date  . 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  Visit Diagnosis:  Orthopedic aftercare  Difficulty walking  Weakness  Hip pain, left      Subjective Assessment - 03/02/15 1614    Subjective L hip is good. Just a little stiff from working 8 hours. No pain.    Pertinent History S/P L THA on December 05, 2014 (S/P 11 weeks) secondary to a fall. Pt was trying to pull something at work, the handle was not attached, and patient fell. Had inpatient and  home health physical therapy for 6 weeks. Pt started with a rw for 4 weeks, and started using her SPC 6 weeks post op. 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   Currenlty has difficulty with standing up from a toilet seat (low sufaces), reaching down to the ground, putting on socks and shoes, walking, stair negotiation (12 steps to get to her second floor bedroom), getting out of bed.  Pt able to cook and wash  dishes.  Pt also states feeling tightness L anterior hip. L leg also feels restless when lying on her back. Pt states that she is walking more without the cane.    Patient Stated Goals Be better able to put shoes on, reach the bottom cabinets, carry her granson.     Currently in Pain? No/denies   Multiple Pain Sites No        Objectives:  There-ex:  Directed patient with sit <> stand from elevated mat table 10x2  Sit <> stand from regular chair with TRX strap assist 10x, cues for hip and knee flexion  Seated hip hinging on chair (with ball between knees for neutral thigh position) 10x4  Standing TRX mini squats 10x3  Seated L hip flexion 10x2   Forward wedding march 32 ft x 2     Then with 4 lbs weight on each hand 32 ft x 4 Side stepping 32 ft x 2 each direction without SPC and holing onto 4 lbs weight each hand  Forward step up onto 3 inch step with L LE 10x2 with R UE assist   Improved exercise technique, femoral control, joint protection, movement at target joints, use of target muscles after mod verbal, visual, tactile cues.      Manual therapy: soft tissue mobilization to L TFL and vastus lateralis (proximal portion)  Pt demonstrates limited L hip flexion ROM which makes sit <> stand with proper form, and donning and doffing shoes challenging.                  PT Education - 03/02/15 1925    Education provided Yes   Education Details ther-ex   [employer]) Educated Patient   Methods Explanation;Demonstration;Tactile cues;Verbal cues   Comprehension Verbalized understanding;Returned demonstration             PT Long Term Goals - 02/23/15 1649    PT LONG TERM GOAL #1   Title Patient will improve L hip strength by at least 1/2 MMT grade to promote ability to ambulate, and negotiate stairs.    Time 4   Period Weeks   Status New   PT LONG TERM GOAL #2   Title Patient will improve her LEFS score by at least 9 points as a demonstration of  improved function.    Time 4   Period Weeks   Status New   PT LONG TERM GOAL #3   Title Patient will have a decrease in L hip pain to 2/10 or less at worst to improve ability to ambulate.    Time 4   Period Weeks   Status New               Plan - 03/02/15 1612    Clinical Impression Statement Pt demonstrates limited L hip flexion ROM which makes sit <> stand with proper form, and donning and doffing shoes challenging.    Pt will benefit from skilled therapeutic intervention in order to improve on the following deficits Abnormal gait;Pain;Difficulty walking;Decreased strength;Improper body mechanics   Rehab Potential Good   Clinical Impairments Affecting Rehab Potential No known clinical impairments affecting rehab potential.    PT Frequency 2x / week   PT Duration 4 weeks   PT Treatment/Interventions Therapeutic activities;Manual techniques;Therapeutic exercise;Aquatic Therapy;Gait training;Scar mobilization;Patient/family education;Ultrasound   PT Next Visit Plan Soft tissue mobilization to L hip muscles, glute med and max strengthening, gait, functional strengthening   Consulted and Agree with Plan of Care Patient        Problem List Patient Active Problem List   Diagnosis Date Noted  . Hypokalemia 12/08/2014  . Hyperglycemia 12/08/2014  . Essential hypertension 12/08/2014  . Closed left hip fracture (HCC) 12/05/2014    Loralyn Freshwater PT, DPT   03/02/2015, 7:26 PM  [place] [facility] PH[facility] AND SPORTS MEDICINE [address], [place], [address] Phone: [phone]   Fax:  [phone]  Name: ZEPPELIN BECKSTRAND MRN: [id number] Date of Birth: 1955-09-29

## 2015-03-04 ENCOUNTER — Ambulatory Visit: Payer: BLUE CROSS/BLUE SHIELD

## 2015-03-04 DIAGNOSIS — R262 Difficulty in walking, not elsewhere classified: Secondary | ICD-10-CM

## 2015-03-04 DIAGNOSIS — M25552 Pain in left hip: Secondary | ICD-10-CM

## 2015-03-04 DIAGNOSIS — R531 Weakness: Secondary | ICD-10-CM

## 2015-03-04 DIAGNOSIS — Z4789 Encounter for other orthopedic aftercare: Secondary | ICD-10-CM | POA: Diagnosis not present

## 2015-03-04 NOTE — Patient Instructions (Signed)
  Sitting upright on your chair, with lumbar towel roll, lift heels up 10 times. Repeat 3 times daily.

## 2015-03-04 NOTE — Therapy (Signed)
[place] [facility] [address] [place], [address] Phone: [phone]   Fax:  [phone]  Physical Therapy Treatment  Patient Details  Name: Latoya Warren MRN: [id number] Date of Birth: 1955-07-15 Referring Provider: Kennedy Bucker, MD  Encounter Date: 03/04/2015      PT End of Session - 03/04/15 1604    Visit Number 4   Number of Visits 9   Date for PT Re-Evaluation 03/26/15   PT Start Time 1604   PT Stop Time 1650   PT Time Calculation (min) 46 min   Activity Tolerance Patient tolerated treatment well   Behavior During Therapy [facility] for tasks assessed/performed      Past Medical History  Diagnosis Date  . Hypertension   . Thyroid disease     Past Surgical History  Procedure Laterality Date  . 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  Visit Diagnosis:  Orthopedic aftercare  Difficulty walking  Weakness  Hip pain, left      Subjective Assessment - 03/04/15 1611    Subjective Pt states L hip is not bothering her.   Pertinent History S/P L THA on December 05, 2014 (S/P 11 weeks) secondary to a fall. Pt was trying to pull something at work, the handle was not attached, and patient fell. Had inpatient and  home health physical therapy for 6 weeks. Pt started with a rw for 4 weeks, and started using her SPC 6 weeks post op. 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   Currenlty has difficulty with standing up from a toilet seat (low sufaces), reaching down to the ground, putting on socks and shoes, walking, stair negotiation (12 steps to get to her second floor bedroom), getting out of bed.  Pt able to cook and wash dishes.  Pt also states feeling  tightness L anterior hip. L leg also feels restless when lying on her back. Pt states that she is walking more without the cane.    Patient Stated Goals Be better able to put shoes on, reach the bottom cabinets, carry her granson.     Currently in Pain? No/denies   Pain Score 0-No pain   Multiple Pain Sites No           Objectives:  There-ex:  Directed patient with seated with lumbar towel roll:  bilateral heel toe raises 10x3 to promote hip flexion ROM    Reviewed and given as part of her HEP. Pt demonstrated and verbalized understanding.   Seated hip hinging on chair (with folded pillow between knees for neutral thigh position) 10x3 with 5 second holds  Sit <> stand from regular chair with bilateral UE assist 10x3, cues for hip and knee flexion  Forward wedding march 32 ft x  4 with 4 lbs weight on each hand    Side stepping 32 ft x 2 each direction  and holding onto 4 lbs weight each hand  Forward step ups onto regular step with bilateral UE assist, cues for L hip flexion 10x2   Improved exercise technique, movement at target joints, use of target muscles after min to mod verbal, visual, tactile cues.       Manual therapy:  soft tissue mobilization to L TFL and vastus lateralis (proximal portion)   Pt tolerated session well without complain of L hip pain. Demonstrates anterior hip muscle tightness and limited hip flexion ROM. Pt also better able to stand up from a regular chair compared to previous sessions.                                  PT Education - 03/04/15 1613    Education provided Yes   Education Details ther-ex, HEP   Person(s) Educated Patient   Methods Explanation;Demonstration;Tactile cues;Verbal cues;Handout   Comprehension Verbalized understanding;Returned demonstration             PT Long Term Goals - 02/23/15 1649    PT LONG TERM GOAL #1   Title Patient will improve L hip strength by at least 1/2 MMT grade to  promote ability to ambulate, and negotiate stairs.    Time 4   Period Weeks   Status New   PT LONG TERM GOAL #2   Title Patient will improve her LEFS score by at least 9 points as a demonstration of improved function.    Time 4   Period Weeks   Status New   PT LONG TERM GOAL #3   Title Patient will have a decrease in L hip pain to 2/10 or less at worst to improve ability to ambulate.    Time 4   Period Weeks   Status New               Plan - 03/04/15 1613    Clinical Impression Statement Pt tolerated session well without complain of L hip pain. Demonstrates anterior hip muscle tightness and limited hip flexion ROM. Pt also better able to stand up from a regular chair compared to previous sessions.    Pt will benefit from skilled therapeutic intervention in order to improve on the following deficits Abnormal gait;Pain;Difficulty walking;Decreased strength;Improper body mechanics   Rehab Potential Good   Clinical Impairments Affecting Rehab Potential No known clinical impairments affecting rehab potential.    PT Frequency 2x / week   PT Duration 4 weeks   PT Treatment/Interventions Therapeutic activities;Manual techniques;Therapeutic exercise;Aquatic Therapy;Gait training;Scar mobilization;Patient/family education;Ultrasound   PT Next Visit Plan Soft tissue mobilization to L hip muscles, glute med and max strengthening, gait, functional strengthening   Consulted and Agree with Plan of Care Patient        Problem List Patient Active Problem List   Diagnosis Date Noted  . Hypokalemia 12/08/2014  . Hyperglycemia 12/08/2014  . Essential hypertension 12/08/2014  . Closed left hip fracture (HCC) 12/05/2014    Loralyn Freshwater PT, DPT   03/04/2015, 5:06 PM  [place] [facility] PHYSICAL AND SPORTS MEDICINE [address] [place], [address] Phone: [phone]   Fax:  [phone]  Name: Latoya Warren MRN: [id number] Date of Birth:  06-Aug-1955

## 2015-03-09 ENCOUNTER — Ambulatory Visit: Payer: BLUE CROSS/BLUE SHIELD

## 2015-03-09 DIAGNOSIS — Z4789 Encounter for other orthopedic aftercare: Secondary | ICD-10-CM

## 2015-03-09 DIAGNOSIS — R531 Weakness: Secondary | ICD-10-CM

## 2015-03-09 DIAGNOSIS — R262 Difficulty in walking, not elsewhere classified: Secondary | ICD-10-CM

## 2015-03-09 DIAGNOSIS — M25552 Pain in left hip: Secondary | ICD-10-CM

## 2015-03-09 NOTE — Therapy (Signed)
[place] [facility] [address] [place], [address] Phone: [phone]   Fax:  [phone]  Physical Therapy Treatment  Patient Details  Name: Latoya Warren MRN: [id number] Date of Birth: 02/15/1955 Referring Provider: Kennedy Bucker, MD  Encounter Date: 03/09/2015      PT End of Session - 03/09/15 1617    Visit Number 5   Number of Visits 9   Date for PT Re-Evaluation 03/26/15   PT Start Time 1617   PT Stop Time 1703   PT Time Calculation (min) 46 min   Activity Tolerance Patient tolerated treatment well   Behavior During Therapy [facility] for tasks assessed/performed      Past Medical History  Diagnosis Date  . Hypertension   . Thyroid disease     Past Surgical History  Procedure Laterality Date  . 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  Visit Diagnosis:  Orthopedic aftercare  Difficulty walking  Weakness  Hip pain, left      Subjective Assessment - 03/09/15 1621    Subjective Pt states that her L hip is doing well. No pain.    Pertinent History S/P L THA on December 05, 2014 (S/P 11 weeks) secondary to a fall. Pt was trying to pull something at work, the handle was not attached, and patient fell. Had inpatient and  home health physical therapy for 6 weeks. Pt started with a rw for 4 weeks, and started using her SPC 6 weeks post op. 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   Currenlty has difficulty with standing up from a toilet seat (low sufaces), reaching down to the Warren, putting on socks and shoes, walking, stair negotiation (12 steps to get to her second floor bedroom), getting out of bed.  Pt able to cook and wash dishes.  Pt also  states feeling tightness L anterior hip. L leg also feels restless when lying on her back. Pt states that she is walking more without the cane.    Patient Stated Goals Be better able to put shoes on, reach the bottom cabinets, carry her granson.     Currently in Pain? No/denies   Pain Score 0-No pain   Multiple Pain Sites No    Pt also states better able to stand up from a chair.   Objectives  Manual therapy:   soft tissue mobilization to L TFL and vastus lateralis (proximal portion)     There-ex  Directed patient with seated bilateral ankle DF/PF with lumbar towel roll 10x2     Then with 3 inch step 10x2 (to promote hip flexion, no ER)  Seated assisted L hip flexion 10x3  Supine hip marches with abdominal contraction 10x2 each LE  Forward wedding march holding onto 5 lbs weights 32 ft x 4  Sit <> stand from regular chair with bilateral UE assist 10x2, cues for bringing center of gravity over base of support  Mini squats with ball touch to wall 10x    Improved exercise technique, movement at target joints, use of target muscles after mod verbal, visual, tactile cues.     Slight improvement with L hip flexion ROM (to better able to bring center of  gravity over base of support) with sit <> stand.                            PT Education - 03/09/15 1621    Education provided Yes   Education Details ther-ex   [employer]) Educated Patient   Methods Explanation;Demonstration;Tactile cues;Verbal cues   Comprehension Verbalized understanding;Returned demonstration             PT Long Term Goals - 02/23/15 1649    PT LONG TERM GOAL #1   Title Patient will improve L hip strength by at least 1/2 MMT grade to promote ability to ambulate, and negotiate stairs.    Time 4   Period Weeks   Status New   PT LONG TERM GOAL #2   Title Patient will improve her LEFS score by at least 9 points as a demonstration of improved function.    Time 4   Period Weeks    Status New   PT LONG TERM GOAL #3   Title Patient will have a decrease in L hip pain to 2/10 or less at worst to improve ability to ambulate.    Time 4   Period Weeks   Status New               Plan - 03/09/15 1622    Clinical Impression Statement Slight improvement with L hip flexion ROM (to better able to bring center of gravity over base of support) with sit <> stand.    Pt will benefit from skilled therapeutic intervention in order to improve on the following deficits Abnormal gait;Pain;Difficulty walking;Decreased strength;Improper body mechanics   Rehab Potential Good   Clinical Impairments Affecting Rehab Potential No known clinical impairments affecting rehab potential.    PT Frequency 2x / week   PT Duration 4 weeks   PT Treatment/Interventions Therapeutic activities;Manual techniques;Therapeutic exercise;Aquatic Therapy;Gait training;Scar mobilization;Patient/family education;Ultrasound   PT Next Visit Plan Soft tissue mobilization to L hip muscles, glute med and max strengthening, gait, functional strengthening   Consulted and Agree with Plan of Care Patient        Problem List Patient Active Problem List   Diagnosis Date Noted  . Hypokalemia 12/08/2014  . Hyperglycemia 12/08/2014  . Essential hypertension 12/08/2014  . Closed left hip fracture (HCC) 12/05/2014    Loralyn Freshwater PT, DPT   03/09/2015, 7:04 PM  [place] [facility] PHYSICAL AND SPORTS MEDICINE [address] [place], [address] Phone: [phone]   Fax:  [phone]  Name: Latoya Warren MRN: [id number] Date of Birth: 1955-09-16

## 2015-03-11 ENCOUNTER — Ambulatory Visit: Payer: BLUE CROSS/BLUE SHIELD

## 2015-03-11 DIAGNOSIS — Z4789 Encounter for other orthopedic aftercare: Secondary | ICD-10-CM | POA: Diagnosis not present

## 2015-03-11 DIAGNOSIS — R531 Weakness: Secondary | ICD-10-CM

## 2015-03-11 DIAGNOSIS — R262 Difficulty in walking, not elsewhere classified: Secondary | ICD-10-CM

## 2015-03-11 DIAGNOSIS — M25552 Pain in left hip: Secondary | ICD-10-CM

## 2015-03-11 NOTE — Therapy (Signed)
[place] [facility] [address], [place], [address] Phone: [phone]   Fax:  [phone]  Physical Therapy Treatment  Patient Details  Name: Latoya Warren MRN: [id number] Date of Birth: December 25, 1955 Referring Provider: Kennedy Bucker, MD  Encounter Date: 03/11/2015      PT End of Session - 03/11/15 1618    Visit Number 6   Number of Visits 9   Date for PT Re-Evaluation 03/26/15   PT Start Time 1618   PT Stop Time 1702   PT Time Calculation (min) 44 min   Activity Tolerance Patient tolerated treatment well   Behavior During Therapy [facility] for tasks assessed/performed      Past Medical History  Diagnosis Date  . Hypertension   . Thyroid disease     Past Surgical History  Procedure Laterality Date  . 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  Visit Diagnosis:  Orthopedic aftercare  Difficulty walking  Weakness  Hip pain, left      Subjective Assessment - 03/11/15 1622    Subjective L hip just feels stiff. Did a lot more walking today and yesterday. Also pushes a cart at work.    Pertinent History S/P L THA on December 05, 2014 (S/P 11 weeks) secondary to a fall. Pt was trying to pull something at work, the handle was not attached, and patient fell. Had inpatient and  home health physical therapy for 6 weeks. Pt started with a rw for 4 weeks, and started using her SPC 6 weeks post op. 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   Currenlty has difficulty with standing up from a toilet seat (low sufaces), reaching down to the ground, putting on socks and shoes, walking, stair negotiation (12 steps to get to her second floor bedroom), getting out of bed.   Pt able to cook and wash dishes.  Pt also states feeling tightness L anterior hip. L leg also feels restless when lying on her back. Pt states that she is walking more without the cane.    Patient Stated Goals Be better able to put shoes on, reach the bottom cabinets, carry her granson.     Currently in Pain? No/denies         Objectives  Manual therapy:   soft tissue mobilization to L TFL and vastus lateralis (proximal portion)   Decreased muscle tension afterwards.    There-ex  Directed patient with seated bilateral ankle DF/PF with lumbar towel roll 10x  Then with 3 inch step 10x2 (to promote hip flexion, no ER)  Seated PT assisted L hip flexion 10x3  Seated assisted L knee flexion/extension rolling small ball (to promote L hip flexion ROM) 10x3  Side stepping holding onto 5 lbs weight 32 ft x 3 each directions (for glute med strength)  Standing mini squats 10x3   Improved exercise technique, movement at target joints, use of target muscles after min to mod verbal, visual, tactile cues.                             PT Education - 03/11/15 1627    Education provided Yes  Education Details ther-ex   Person(s) Educated Patient   Methods Explanation;Demonstration;Tactile cues;Verbal cues   Comprehension Verbalized understanding;Returned demonstration             PT Long Term Goals - 02/23/15 1649    PT LONG TERM GOAL #1   Title Patient will improve L hip strength by at least 1/2 MMT grade to promote ability to ambulate, and negotiate stairs.    Time 4   Period Weeks   Status New   PT LONG TERM GOAL #2   Title Patient will improve her LEFS score by at least 9 points as a demonstration of improved function.    Time 4   Period Weeks   Status New   PT LONG TERM GOAL #3   Title Patient will have a decrease in L hip pain to 2/10 or less at worst to improve ability to ambulate.    Time 4   Period Weeks   Status New                Plan - 03/11/15 1627    Clinical Impression Statement No complain of L hip pain during session. Demonstrates lumbar flexion compensation with mini squats secondary to limited L hip flexion ROM.    Pt will benefit from skilled therapeutic intervention in order to improve on the following deficits Abnormal gait;Pain;Difficulty walking;Decreased strength;Improper body mechanics   Rehab Potential Good   Clinical Impairments Affecting Rehab Potential No known clinical impairments affecting rehab potential.    PT Frequency 2x / week   PT Duration 4 weeks   PT Treatment/Interventions Therapeutic activities;Manual techniques;Therapeutic exercise;Aquatic Therapy;Gait training;Scar mobilization;Patient/family education;Ultrasound   PT Next Visit Plan Soft tissue mobilization to L hip muscles, glute med and max strengthening, gait, functional strengthening   Consulted and Agree with Plan of Care Patient        Problem List Patient Active Problem List   Diagnosis Date Noted  . Hypokalemia 12/08/2014  . Hyperglycemia 12/08/2014  . Essential hypertension 12/08/2014  . Closed left hip fracture (HCC) 12/05/2014    Loralyn Freshwater PT, DPT   03/11/2015, 5:11 PM  [place] [facility] PHYSICAL AND SPORTS MEDICINE [address] [place], [address] Phone: [phone]   Fax:  [phone]  Name: Latoya Warren MRN: [id number] Date of Birth: [birth year]

## 2015-03-16 ENCOUNTER — Ambulatory Visit: Payer: BLUE CROSS/BLUE SHIELD

## 2015-03-18 ENCOUNTER — Ambulatory Visit: Payer: BLUE CROSS/BLUE SHIELD

## 2015-03-18 DIAGNOSIS — R262 Difficulty in walking, not elsewhere classified: Secondary | ICD-10-CM

## 2015-03-18 DIAGNOSIS — Z4789 Encounter for other orthopedic aftercare: Secondary | ICD-10-CM

## 2015-03-18 DIAGNOSIS — M25552 Pain in left hip: Secondary | ICD-10-CM

## 2015-03-18 DIAGNOSIS — R531 Weakness: Secondary | ICD-10-CM

## 2015-03-18 NOTE — Therapy (Addendum)
[place] [facility] MEDIC[facility] [address], [place], [address] Phone: [phone]   Fax:  [phone]  Physical Therapy Treatment  Patient Details  Name: Latoya Warren MRN: [id number] Date of Birth: [birth year] Referring Provider: Kennedy Bucker, MD  Encounter Date: 03/18/2015      PT End of Session - 03/18/15 1609    Visit Number 7   Number of Visits 9   Date for PT Re-Evaluation 03/26/15   PT Start Time 1609   PT Stop Time 1655   PT Time Calculation (min) 46 min   Activity Tolerance Patient tolerated treatment well   Behavior During Therapy [facility] for tasks assessed/performed      Past Medical History  Diagnosis Date  . Hypertension   . Thyroid disease     Past Surgical History  Procedure Laterality Date  . 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  Visit Diagnosis:  Orthopedic aftercare  Difficulty walking  Weakness  Hip pain, left      Subjective Assessment - 03/18/15 1610    Subjective Pt states feeling better. L hip feels good. No pain. Getting up from a regular chair is better.    Pertinent History S/P L THA on December 05, 2014 (S/P 11 weeks) secondary to a fall. Pt was trying to pull something at work, the handle was not attached, and patient fell. Had inpatient and  home health physical therapy for 6 weeks. Pt started with a rw for 4 weeks, and started using her SPC 6 weeks post op. 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   Currenlty has difficulty with standing up from a toilet seat (low sufaces), reaching down to the ground, putting on socks and shoes, walking, stair negotiation (12 steps to get to her second floor bedroom), getting out of bed.   Pt able to cook and wash dishes.  Pt also states feeling tightness L anterior hip. L leg also feels restless when lying on her back. Pt states that she is walking more without the cane.    Patient Stated Goals Be better able to put shoes on, reach the bottom cabinets, carry her granson.     Currently in Pain? No/denies   Pain Score 0-No pain   Multiple Pain Sites No      There-ex  Directed patient with seated bilateral ankle DF/PF with lumbar towel roll 10x2 on 3 inch step (to promote hip flexion, no ER)  Then with one riser 10x2      Then with 2 risers 10x2        Sit <> stand from regular chair with arms, emphasis on bringing center of mass over base of support to promote hip flexion ROM 10x3  Seated PT assisted L hip flexion 10x3  Seated manually resisted L LE leg press from L hip flexion position 10x3 (to promote strength standing up from lower surfaces such as a toilet seat)  Standing mini squats 10x3  Improved exercise technique, movement at target joints, use of target muscles after min to mod verbal, visual, tactile cues.       Manual therapy:   soft tissue mobilization to L TFL and vastus lateralis (proximal portion)  Decreased  muscle tension afterwards.                              PT Education - 03/18/15 1903    Education provided Yes   Education Details ther-ex   [employer]) Educated Patient   Methods Explanation;Demonstration;Tactile cues;Verbal cues   Comprehension Verbalized understanding;Returned demonstration             PT Long Term Goals - 02/23/15 1649    PT LONG TERM GOAL #1   Title Patient will improve L hip strength by at least 1/2 MMT grade to promote ability to ambulate, and negotiate stairs.    Time 4   Period Weeks   Status New   PT LONG TERM GOAL #2   Title Patient will improve her LEFS score by at least 9 points as a demonstration of improved function.    Time 4   Period Weeks   Status New   PT LONG TERM  GOAL #3   Title Patient will have a decrease in L hip pain to 2/10 or less at worst to improve ability to ambulate.    Time 4   Period Weeks   Status New               Plan - 03/18/15 1655    Clinical Impression Statement Improved hip and knee flexion and form with mini squats and sit <> stand from regular chair height   Pt will benefit from skilled therapeutic intervention in order to improve on the following deficits Abnormal gait;Pain;Difficulty walking;Decreased strength;Improper body mechanics   Rehab Potential Good   Clinical Impairments Affecting Rehab Potential No known clinical impairments affecting rehab potential.    PT Frequency 2x / week   PT Duration 4 weeks   PT Treatment/Interventions Therapeutic activities;Manual techniques;Therapeutic exercise;Aquatic Therapy;Gait training;Scar mobilization;Patient/family education;Ultrasound   PT Next Visit Plan Soft tissue mobilization to L hip muscles, glute med and max strengthening, gait, functional strengthening   Consulted and Agree with Plan of Care Patient        Problem List Patient Active Problem List   Diagnosis Date Noted  . Hypokalemia 12/08/2014  . Hyperglycemia 12/08/2014  . Essential hypertension 12/08/2014  . Closed left hip fracture (HCC) 12/05/2014    Loralyn Freshwater PT, DPT    03/18/2015, 7:23 PM  [place] [facility] PHYSICAL AND SPORTS MEDICINE [address] [place], [address] Phone: [phone]   Fax:  [phone]  Name: KATERA RYBKA MRN: [id number] Date of Birth: [birth year]

## 2015-03-23 ENCOUNTER — Ambulatory Visit: Payer: BLUE CROSS/BLUE SHIELD

## 2015-03-23 DIAGNOSIS — Z4789 Encounter for other orthopedic aftercare: Secondary | ICD-10-CM | POA: Diagnosis not present

## 2015-03-23 DIAGNOSIS — M25552 Pain in left hip: Secondary | ICD-10-CM

## 2015-03-23 DIAGNOSIS — R262 Difficulty in walking, not elsewhere classified: Secondary | ICD-10-CM

## 2015-03-23 DIAGNOSIS — R531 Weakness: Secondary | ICD-10-CM

## 2015-03-23 NOTE — Therapy (Signed)
[facility] [address], [place], [address] Phone: [phone]   Fax:  [phone]  Physical Therapy Treatment  Patient Details  Name: Latoya Warren MRN: [id number] Date of Birth: [birth year] Referring Provider: Kennedy Bucker, MD  Encounter Date: 03/23/2015      PT End of Session - 03/23/15 1613    Visit Number 8   Number of Visits 9   Date for PT Re-Evaluation 03/26/15   PT Start Time 1613   PT Stop Time 1654   PT Time Calculation (min) 41 min   Activity Tolerance Patient tolerated treatment well   Behavior During Therapy [facility] for tasks assessed/performed      Past Medical History  Diagnosis Date  . Hypertension   . Thyroid disease     Past Surgical History  Procedure Laterality Date  . 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  Visit Diagnosis:  Orthopedic aftercare  Difficulty walking  Weakness  Hip pain, left      Subjective Assessment - 03/23/15 1615    Subjective Pt states no L hip pain. Also worked 8 hours today. Gave up the high toilet seat this weekend. Getting up from the toilet is better.    Pertinent History S/P L THA on December 05, 2014 (S/P 11 weeks) secondary to a fall. Pt was trying to pull something at work, the handle was not attached, and patient fell. Had inpatient and  home health physical therapy for 6 weeks. Pt started with a rw for 4 weeks, and started using her SPC 6 weeks post op. 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   Currenlty has difficulty with standing up from a toilet seat (low sufaces), reaching down to the ground, putting on socks and shoes, walking, stair negotiation (12 steps to get to her second  floor bedroom), getting out of bed.  Pt able to cook and wash dishes.  Pt also states feeling tightness L anterior hip. L leg also feels restless when lying on her back. Pt states that she is walking more without the cane.    Patient Stated Goals Be better able to put shoes on, reach the bottom cabinets, carry her granson.     Currently in Pain? No/denies   Pain Score 0-No pain   Multiple Pain Sites No             PT Education - 03/23/15 1641    Education provided Yes   Education Details ther-ex, progress L hip strength   Person(s) Educated Patient   Methods Explanation;Demonstration;Tactile cues;Verbal cues   Comprehension Verbalized understanding;Returned demonstration    Pt also states that picking up items from the floor is better.    Objectives    There-ex   supine L hip flexion PROM to 90 degrees by PT 10x2    Then AAROM to 90 degrees with PT assist 10x3 S/L manually resisted L hip abduction 1x Seated manually resisted L hip flexion 1x Reviewed progress with L hip strength with pt  Sit <> stand 10x2 from regular chair with arms Seated manually resisted L LE leg press from L hip flexion position 10x3 (to promote strength standing up from lower surfaces such  as a toilet seat) Side stepping to the R resisting black theratube 10x (to promote hip strength)   Improved exercise technique, movement at target joints, use of target muscles after min to mod verbal, visual, tactile cues.     Manual therapy:   soft tissue mobilization to L TFL and vastus lateralis (proximal and middle portion)   Pt tolerated session well without complain of hip pain. Pt better able to place her center of gravity over her base of support when standing up from a chair secondary to improved L hip flexion ROM. Pt also demonstrates improved L hip strength and LEFS score suggesting improved function since initial evaluation. Patient making good progress towards goals.            PT Long  Term Goals - 03/23/15 1913    PT LONG TERM GOAL #1   Title Patient will improve L hip strength by at least 1/2 MMT grade to promote ability to ambulate, and negotiate stairs.    Time 4   Period Weeks   Status Achieved   PT LONG TERM GOAL #2   Title Patient will improve her LEFS score by at least 9 points as a demonstration of improved function.    Time 4   Period Weeks   Status Achieved   PT LONG TERM GOAL #3   Title Patient will have a decrease in L hip pain to 2/10 or less at worst to improve ability to ambulate.    Time 4   Period Weeks   Status On-going               Plan - 03/23/15 1641    Clinical Impression Statement Pt tolerated session well without complain of hip pain. Pt better able to place her center of gravity over her base of support when standing up from a chair secondary to improved L hip flexion ROM. Pt also demonstrates improved L hip strength and LEFS score suggesting improved function since initial evaluation. Patient making good progress towards goals.    Pt will benefit from skilled therapeutic intervention in order to improve on the following deficits Abnormal gait;Pain;Difficulty walking;Decreased strength;Improper body mechanics   Rehab Potential Good   Clinical Impairments Affecting Rehab Potential No known clinical impairments affecting rehab potential.    PT Frequency 2x / week   PT Duration 4 weeks   PT Treatment/Interventions Therapeutic activities;Manual techniques;Therapeutic exercise;Aquatic Therapy;Gait training;Scar mobilization;Patient/family education;Ultrasound   PT Next Visit Plan Soft tissue mobilization to L hip muscles, glute med and max strengthening, gait, functional strengthening   Consulted and Agree with Plan of Care Patient        Problem List Patient Active Problem List   Diagnosis Date Noted  . Hypokalemia 12/08/2014  . Hyperglycemia 12/08/2014  . Essential hypertension 12/08/2014  . Closed left hip fracture (HCC)  12/05/2014    Loralyn Freshwater PT, DPT   03/23/2015, 7:18 PM  [facility] PH[facility]ICAL AND SPORTS MEDICINE [address], [place], [address] Phone: [phone]   Fax:  [phone]  Name: Latoya Warren MRN: [id number] Date of Birth: 1955-10-12

## 2015-03-25 ENCOUNTER — Ambulatory Visit: Payer: BLUE CROSS/BLUE SHIELD | Attending: Orthopedic Surgery

## 2015-03-25 DIAGNOSIS — R262 Difficulty in walking, not elsewhere classified: Secondary | ICD-10-CM | POA: Diagnosis present

## 2015-03-25 DIAGNOSIS — M25552 Pain in left hip: Secondary | ICD-10-CM | POA: Diagnosis present

## 2015-03-25 DIAGNOSIS — Z4789 Encounter for other orthopedic aftercare: Secondary | ICD-10-CM

## 2015-03-25 DIAGNOSIS — R531 Weakness: Secondary | ICD-10-CM | POA: Diagnosis present

## 2015-03-25 NOTE — Patient Instructions (Signed)
  Standing and holding onto something sturdy, lift right foot up 10 times for 3 sets to feel muscles working at the side of your left hip.

## 2015-03-25 NOTE — Therapy (Signed)
[facility] [address], [place], [address] Phone: [phone]   Fax:  [phone]  Physical Therapy Treatment  Patient Details  Name: Latoya Warren MRN: [id number] Date of Birth: 11/14/1955 Referring Provider: Kennedy Bucker, MD  Encounter Date: 03/25/2015      PT End of Session - 03/25/15 1615    Visit Number 9   Number of Visits 4   Date for PT Re-Evaluation 04/23/15  plan is for 04/09/2015   PT Start Time 1615   PT Stop Time 1659   PT Time Calculation (min) 44 min   Activity Tolerance Patient tolerated treatment well   Behavior During Therapy [facility] for tasks assessed/performed      Past Medical History  Diagnosis Date  . Hypertension   . Thyroid disease     Past Surgical History  Procedure Laterality Date  . 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  Visit Diagnosis:  Orthopedic aftercare - Plan: PT plan of care cert/re-cert  Difficulty walking - Plan: PT plan of care cert/re-cert  Weakness - Plan: PT plan of care cert/re-cert  Hip pain, left - Plan: PT plan of care cert/re-cert      Subjective Assessment - 03/25/15 1613    Subjective Pt states L hip is fine. Was able to stand all day for work. 2-3/10 L hip pain at most for the past 3 days. Pt states that she feels like she is doing better, getting closer to her goals. Able to walk on the grass with better balance. Better able to pick up stuff from the ground. Patient also adds feeling a little wobbly after standing up from sitting in a car. Feels better when she stands for a little bit before walking.    Pertinent History S/P L THA on December 05, 2014 (S/P 11 weeks) secondary to a fall. Pt was trying to pull something at work, the handle was not  attached, and patient fell. Had inpatient and  home health physical therapy for 6 weeks. Pt started with a rw for 4 weeks, and started using her SPC 6 weeks post op. 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   Currenlty has difficulty with standing up from a toilet seat (low sufaces), reaching down to the ground, putting on socks and shoes, walking, stair negotiation (12 steps to get to her second floor bedroom), getting out of bed.  Pt able to cook and wash dishes.  Pt also states feeling tightness L anterior hip. L leg also feels restless when lying on her back. Pt states that she is walking more without the cane.    Patient Stated Goals Be better able to put shoes on, reach the bottom cabinets, carry her granson.     Currently in Pain? No/denies   Pain Score 0-No pain   Multiple Pain Sites No            [facility] PT Assessment - 03/25/15 1906    Observation/Other Assessments   Lower Extremity Functional Scale  56/80  Measured on 03/18/2015   Strength   Left Hip Flexion 4/5  measured on 03/23/2015   Left Hip ABduction 4/5  measured on 03/23/2015  Objectives  Manual therapy:   soft tissue mobilization to L TFL and vastus lateralis (proximal and middle portion). Pt education on soft tissue technique for her to perform at home as well. Cues needed.    There-ex   supine L hip flexion PROM to 90 degrees by PT 10x3   Then AAROM to 90 degrees in supine  with PT assist 10x3 Sit <> stand 10x from regular chair with arms Seated manually resisted L LE leg press from L hip flexion position 10x2 (to promote strength standing up from lower surfaces such as a toilet seat) Seated L hip flexion 10x2 Standing on L LE with R toe taps onto treadmill 10x3 for L glute med use Standing mini squats 10x3 with light touch assist SLS on L LE 10x 5 second holds for L glute med use   Improved exercise technique, movement at  target joints, use of target muscles after min to mod verbal, visual, tactile cues.   Pt has demonstrated improved L hip strength and LEFS score (improved by 23 points) suggesting improved function since initial evaluation. Pt still demonstrates some difficulty with standing up from a chair or lower surfaces, as well as walking right after sitting for long periods. Pt will benefit from continued skilled physical therapy services to address the aforementioned deficits and to continue improving function, improving strength and prepare for discharge.                     PT Education - 03/25/15 1653    Education provided Yes   Education Details ther-ex, HEP   Person(s) Educated Patient   Methods Explanation;Demonstration;Tactile cues;Verbal cues;Handout   Comprehension Verbalized understanding;Returned demonstration             PT Long Term Goals - 03/25/15 1910    PT LONG TERM GOAL #1   Title Patient will improve L hip strength by at least 1/2 MMT grade to promote ability to ambulate, and negotiate stairs.    Time 4   Period Weeks   Status Achieved   PT LONG TERM GOAL #2   Title Patient will improve her LEFS score by at least 9 points as a demonstration of improved function.    Time 4   Period Weeks   Status Achieved   PT LONG TERM GOAL #3   Title Patient will have a decrease in L hip pain to 2/10 or less at worst to improve ability to ambulate.    Time 4   Period Weeks   Status On-going   PT LONG TERM GOAL #4   Title Patient will improve her LEFS score to at least 65/80 as a demonstration of improved function.    Baseline current score: 56/80   Time 4   Period Weeks   Status New               Plan - 03/25/15 1613    Clinical Impression Statement Pt has demonstrated improved L hip strength and LEFS score (improved by 23 points) suggesting improved function since initial evaluation. Pt still demonstrates some difficulty with standing up from a chair or  lower surfaces, as well as walking right after sitting for long periods. Pt will benefit from continued skilled physical therapy services to address the aforementioned deficits and to continue improving function, improving strength and prepare for discharge.    Pt will benefit from skilled therapeutic intervention in order to improve on the following deficits Abnormal gait;Pain;Difficulty walking;Decreased strength;Improper body mechanics  Rehab Potential Good   Clinical Impairments Affecting Rehab Potential No known clinical impairments affecting rehab potential.    PT Frequency 2x / week   PT Duration 4 weeks   PT Treatment/Interventions Therapeutic activities;Manual techniques;Therapeutic exercise;Aquatic Therapy;Gait training;Scar mobilization;Patient/family education;Ultrasound   PT Next Visit Plan Soft tissue mobilization to L hip muscles, glute med and max strengthening, gait, functional strengthening   Consulted and Agree with Plan of Care Patient        Problem List Patient Active Problem List   Diagnosis Date Noted  . Hypokalemia 12/08/2014  . Hyperglycemia 12/08/2014  . Essential hypertension 12/08/2014  . Closed left hip fracture (HCC) 12/05/2014   Thank you for your referral.   Loralyn Freshwater PT, DPT   03/25/2015, 7:29 PM   [facility]TS MED[facility]INE [address] [place], [address] Phone: [phone]   Fax:  [phone]  Name: Latoya Warren MRN: [id number] Date of Birth: 03-26-1955

## 2015-03-30 ENCOUNTER — Ambulatory Visit: Payer: BLUE CROSS/BLUE SHIELD

## 2015-03-30 DIAGNOSIS — Z4789 Encounter for other orthopedic aftercare: Secondary | ICD-10-CM | POA: Diagnosis not present

## 2015-03-30 DIAGNOSIS — R531 Weakness: Secondary | ICD-10-CM

## 2015-03-30 DIAGNOSIS — M25552 Pain in left hip: Secondary | ICD-10-CM

## 2015-03-30 DIAGNOSIS — R262 Difficulty in walking, not elsewhere classified: Secondary | ICD-10-CM

## 2015-03-30 NOTE — Patient Instructions (Signed)
Forward step up onto regular step with L LE with bilateral UE assist: reviewed and given as part of her HEP 3x10 daily. Pt demonstrated and verbalized understanding.

## 2015-03-30 NOTE — Therapy (Signed)
[facility] [address] [place], [address] Phone: [phone]   Fax:  [phone]  Physical Therapy Treatment  Patient Details  Name: Latoya Warren MRN: [id number] Date of Birth: 1955-09-17 Referring Provider: Kennedy Bucker, MD  Encounter Date: 03/30/2015       PT End of Session - 03/30/15 1617    Visit Number 10   Number of Visits 17   Date for PT Re-Evaluation 04/23/15  plan is for 04/09/2015   PT Start Time 1617   PT Stop Time 1701   PT Time Calculation (min) 44 min   Activity Tolerance Patient tolerated treatment well   Behavior During Therapy [facility] for tasks assessed/performed      Past Medical History  Diagnosis Date  . Hypertension   . Thyroid disease     Past Surgical History  Procedure Laterality Date  . 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  Visit Diagnosis:  Orthopedic aftercare  Difficulty walking  Weakness  Hip pain, left      Subjective Assessment - 03/30/15 1619    Subjective L hip is doing good.    Pertinent History S/P L THA on December 05, 2014 (S/P 11 weeks) secondary to a fall. Pt was trying to pull something at work, the handle was not attached, and patient fell. Had inpatient and  home health physical therapy for 6 weeks. Pt started with a rw for 4 weeks, and started using her SPC 6 weeks post op. 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   Currenlty has difficulty with standing up from a toilet seat (low sufaces), reaching down to the ground, putting on socks and shoes, walking, stair negotiation (12 steps to get to her second floor bedroom), getting out of bed.  Pt able to cook and wash dishes.  Pt also states  feeling tightness L anterior hip. L leg also feels restless when lying on her back. Pt states that she is walking more without the cane.    Patient Stated Goals Be better able to put shoes on, reach the bottom cabinets, carry her granson.     Currently in Pain? No/denies   Pain Score 0-No pain   Multiple Pain Sites No      Objectives:  Manual therapy:   STM L hip flexor muscles and vastus lateralis  Pt education on performing technique. Pt demonstrated and verbalized understanding.      There-ex   supine L hip flexion PROM to 90 degrees by PT 10x3  Then AAROM to 90 degrees in supine with PT assist 10x3  Sit <> stand 10x2 from regular chair with arms  Seated manually resisted L LE leg press from L hip flexion position 10x3 (to promote strength standing up from lower surfaces such as a toilet seat)  SLS on L LE 10x 5 second holds for L glute med use with bilateral UE assist Standing on L LE with R toe taps onto treadmill 10x3 for L glute med use with bilateral UE assist Forward step up onto Air Ex pad with L LE 10x2 with emphasis on femoral control Forward step up onto regular step 10x2 with L LE with bilateral UE assist (  reviewed and given as part of her HEP 3x10 daily; pt demonstrated and verbalized understanding)   Improved exercise technique, movement at target joints, use of target muscles after min to mod verbal, visual, tactile cues.    Decreased L hip flexor muscle tightness after manual therapy. Improved L femoral control with step up exercises after cues. Good glute med muscle use with SLS on L LE exercises with bilateral UE assist                     PT Education - 03/30/15 1633    Education provided Yes   Education Details ther-ex   Starwood HotelsPerson(s) Educated Patient   Methods Explanation;Demonstration;Tactile cues;Verbal cues   Comprehension Verbalized understanding;Returned demonstration             PT Long Term Goals - 03/25/15 1910    PT  LONG TERM GOAL #1   Title Patient will improve L hip strength by at least 1/2 MMT grade to promote ability to ambulate, and negotiate stairs.    Time 4   Period Weeks   Status Achieved   PT LONG TERM GOAL #2   Title Patient will improve her LEFS score by at least 9 points as a demonstration of improved function.    Time 4   Period Weeks   Status Achieved   PT LONG TERM GOAL #3   Title Patient will have a decrease in L hip pain to 2/10 or less at worst to improve ability to ambulate.    Time 4   Period Weeks   Status On-going   PT LONG TERM GOAL #4   Title Patient will improve her LEFS score to at least 65/80 as a demonstration of improved function.    Baseline current score: 56/80   Time 4   Period Weeks   Status New               Plan - 03/30/15 1634    Clinical Impression Statement Decreased L hip flexor muscle tightness after manual therapy. Improved L femoral control with step up exercises after cues. Good glute med muscle use with SLS on L LE exercises with bilateral UE assist   Pt will benefit from skilled therapeutic intervention in order to improve on the following deficits Abnormal gait;Pain;Difficulty walking;Decreased strength;Improper body mechanics   Rehab Potential Good   Clinical Impairments Affecting Rehab Potential No known clinical impairments affecting rehab potential.    PT Frequency 2x / week   PT Duration 4 weeks   PT Treatment/Interventions Therapeutic activities;Manual techniques;Therapeutic exercise;Aquatic Therapy;Gait training;Scar mobilization;Patient/family education;Ultrasound   PT Next Visit Plan Soft tissue mobilization to L hip muscles, glute med and max strengthening, gait, functional strengthening   Consulted and Agree with Plan of Care Patient        Problem List Patient Active Problem List   Diagnosis Date Noted  . Hypokalemia 12/08/2014  . Hyperglycemia 12/08/2014  . Essential hypertension 12/08/2014  . Closed left hip fracture  (HCC) 12/05/2014    Loralyn FreshwaterMiguel Xzavior Reinig PT, DPT   03/30/2015, 7:17 PM  [facility] [address] [place], [place], [id number] Phone: [phone]   Fax:  [phone]  Name: Lemmie Evensheresa O Timmers MRN: [id number] Date of Birth: 09-Oct-1955

## 2015-04-01 ENCOUNTER — Ambulatory Visit: Payer: BLUE CROSS/BLUE SHIELD

## 2015-04-01 DIAGNOSIS — R262 Difficulty in walking, not elsewhere classified: Secondary | ICD-10-CM

## 2015-04-01 DIAGNOSIS — Z4789 Encounter for other orthopedic aftercare: Secondary | ICD-10-CM

## 2015-04-01 DIAGNOSIS — M25552 Pain in left hip: Secondary | ICD-10-CM

## 2015-04-01 DIAGNOSIS — R531 Weakness: Secondary | ICD-10-CM

## 2015-04-01 NOTE — Therapy (Signed)
[facility] PHYSICAL AND SPORTS MEDICINE [address], [place], [address] Phone: [phone]   Fax:  [phone]  Physical Therapy Treatment  Patient Details  Name: Latoya Warren MRN: [id number] Date of Birth: 1955-02-26 Referring Provider: Kennedy Bucker, MD  Encounter Date: 04/01/2015      PT End of Session - 04/01/15 1616    Visit Number 11   Number of Visits 17   Date for PT Re-Evaluation 04/23/15  plan is for 04/09/2015   Authorization Type 11   Authorization Time Period of 12  12 approved visits   PT Start Time 1617   PT Stop Time 1712   PT Time Calculation (min) 55 min   Activity Tolerance Patient tolerated treatment well   Behavior During Therapy [facility] for tasks assessed/performed      Past Medical History  Diagnosis Date  . Hypertension   . Thyroid disease     Past Surgical History  Procedure Laterality Date  . 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  Visit Diagnosis:  Orthopedic aftercare  Difficulty walking  Weakness  Hip pain, left      Subjective Assessment - 04/01/15 1618    Subjective Pt states standing a lot more at work now. Legs feel week and tired R > L. 0/10 pain L hip for the past 3 days.    Pertinent History S/P L THA on December 05, 2014 (S/P 11 weeks) secondary to a fall. Pt was trying to pull something at work, the handle was not attached, and patient fell. Had inpatient and  home health physical therapy for 6 weeks. Pt started with a rw for 4 weeks, and started using her SPC 6 weeks post op. 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   Currenlty has difficulty with standing up from a toilet seat (low sufaces), reaching down to the ground,  putting on socks and shoes, walking, stair negotiation (12 steps to get to her second floor bedroom), getting out of bed.  Pt able to cook and wash dishes.  Pt also states feeling tightness L anterior hip. L leg also feels restless when lying on her back. Pt states that she is walking more without the cane.    Patient Stated Goals Be better able to put shoes on, reach the bottom cabinets, carry her granson.     Currently in Pain? No/denies   Pain Score 0-No pain   Multiple Pain Sites No    Pt also adds that the wobbliness she feels when she stands up from sitting a long period is better.        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   supine L hip flexion PROM to 90 degrees by PT 10x3  Then AAROM to 90 degrees in supine with PT assist 10x3  Egonomic mini squats with hands lightly tapping 12 inch stool 10x2 (to promote ability to lift 2-3 lb item from 12 inch height  at work)  Sit <> stand 10x from regular chair with arms  SLS on L LE with bilateral UE assist 10x5 seconds for 2 sets for L glute med muscle use  Ascending and descending 4 regular steps with bilateral UE assist 6x with cues for femoral control, and slight L hip flexion when stepping down with R LE to protect surgery   Improved exercise technique, movement at target joints, use of target muscles after mod verbal, visual, tactile cues.      Pt tolerated session well without complain of pain. Improved ability to stand up from a chair, ergonomics with lifting an item about 12 inches from the floor, and ability to ascend and descend stairs while protecting her L hip.            PT Education - 04/01/15 1705    Education provided Yes   Education Details ther-ex   [employer]) Educated Patient    Methods Explanation;Demonstration;Verbal cues   Comprehension Verbalized understanding;Returned demonstration             PT Long Term Goals - 03/25/15 1910    PT LONG TERM GOAL #1   Title Patient will improve L hip strength by at least 1/2 MMT grade to promote ability to ambulate, and negotiate stairs.    Time 4   Period Weeks   Status Achieved   PT LONG TERM GOAL #2   Title Patient will improve her LEFS score by at least 9 points as a demonstration of improved function.    Time 4   Period Weeks   Status Achieved   PT LONG TERM GOAL #3   Title Patient will have a decrease in L hip pain to 2/10 or less at worst to improve ability to ambulate.    Time 4   Period Weeks   Status On-going   PT LONG TERM GOAL #4   Title Patient will improve her LEFS score to at least 65/80 as a demonstration of improved function.    Baseline current score: 56/80   Time 4   Period Weeks   Status New               Plan - 04/01/15 1705    Clinical Impression Statement Pt tolerated session well without complain of pain. Improved ability to stand up from a chair, ergonomics with lifting an item about 12 inches from the floor, and ability to ascend and descend stairs while protecting her L hip.    Pt will benefit from skilled therapeutic intervention in order to improve on the following deficits Abnormal gait;Pain;Difficulty walking;Decreased strength;Improper body mechanics   Rehab Potential Good   Clinical Impairments Affecting Rehab Potential No known clinical impairments affecting rehab potential.    PT Frequency 2x / week   PT Duration 4 weeks   PT Treatment/Interventions Therapeutic activities;Manual techniques;Therapeutic exercise;Aquatic Therapy;Gait training;Scar mobilization;Patient/family education;Ultrasound   PT Next Visit Plan Soft tissue mobilization to L hip muscles, glute med and max strengthening, gait, functional strengthening   Consulted and Agree with Plan of Care  Patient        Problem List Patient Active Problem List   Diagnosis Date Noted  . Hypokalemia 12/08/2014  . Hyperglycemia 12/08/2014  . Essential hypertension 12/08/2014  . Closed left hip fracture (HCC) 12/05/2014    Loralyn Freshwater PT, DPT   04/01/2015, 5:25 PM  [facility] PHYSICAL AND SPORTS MEDICINE [address] [place], [address] Phone: [phone]   Fax:  [phone]  Name: Lemmie Evensheresa O Furukawa MRN: [id number] Date of Birth: 01-22-1956

## 2015-04-08 ENCOUNTER — Ambulatory Visit: Payer: BLUE CROSS/BLUE SHIELD

## 2015-04-08 DIAGNOSIS — M25552 Pain in left hip: Secondary | ICD-10-CM

## 2015-04-08 DIAGNOSIS — R531 Weakness: Secondary | ICD-10-CM

## 2015-04-08 DIAGNOSIS — Z4789 Encounter for other orthopedic aftercare: Secondary | ICD-10-CM

## 2015-04-08 DIAGNOSIS — R262 Difficulty in walking, not elsewhere classified: Secondary | ICD-10-CM

## 2015-04-08 NOTE — Therapy (Signed)
[facility] PH[facility]ICINE [address] [place], [address] Phone: [phone]   Fax:  [phone]  Physical Therapy Treatment And Discharge Summary  Patient Details  Name: Latoya Warren MRN: [id number] Date of Birth: March 23, 1955 Referring Provider: Kennedy Bucker, MD  Encounter Date: 04/08/2015      PT End of Session - 04/08/15 1707    Visit Number 12   Number of Visits 17   Date for PT Re-Evaluation 04/23/15  plan is for 04/09/2015   Authorization Type 12   Authorization Time Period of 12  12 approved visits   PT Start Time 1707   PT Stop Time 1749   PT Time Calculation (min) 42 min   Activity Tolerance Patient tolerated treatment well   Behavior During Therapy [facility] for tasks assessed/performed      Past Medical History  Diagnosis Date  . Hypertension   . Thyroid disease     Past Surgical History  Procedure Laterality Date  . 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  Visit Diagnosis:  Orthopedic aftercare  Difficulty walking  Weakness  Hip pain, left      Subjective Assessment - 04/08/15 1715    Subjective No L hip pain. The wobbliness after getting out of a car and from sitting for a while is better. Better able to sit down and get up from a commode. Reaching the bottom cabinet is still hard but better. Carrying her grandson (about 20 lbs) is a little bit better (baby steps). Friday, L hip pain was 4/10 at most (pt worked 8 hours, went to [employer], and then to church).   Pertinent History S/P L THA on December 05, 2014 (S/P 11 weeks) secondary to a fall. Pt was trying to pull something at work, the handle was not attached, and patient fell. Had inpatient and  home health physical therapy for 6 weeks. Pt  started with a rw for 4 weeks, and started using her SPC 6 weeks post op. 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   Currenlty has difficulty with standing up from a toilet seat (low sufaces), reaching down to the ground, putting on socks and shoes, walking, stair negotiation (12 steps to get to her second floor bedroom), getting out of bed.  Pt able to cook and wash dishes.  Pt also states feeling tightness L anterior hip. L leg also feels restless when lying on her back. Pt states that she is walking more without the cane.    Patient Stated Goals Be better able to put shoes on, reach the bottom cabinets, carry her granson.     Currently in Pain? No/denies   Pain Score 0-No pain   Multiple Pain Sites No         [facility] PT Assessment - 04/08/15 1935    Observation/Other Assessments   Lower Extremity Functional Scale  69/80        Objectives:  Manual therapy:   STM L hip flexor muscles and vastus lateralis   Pt re-education on self STM to aforementioned muscles. Pt demonstrated understanding.     There-ex   Supine L hip flexion PROM to 90 degrees by PT 10x3  Then AAROM to 90 degrees in supine with PT assist 10x3 SLS on L LE with bilateral UE assist 10x5 seconds for 2 sets for L glute med muscle use  Standing mini to medium squat with one UE assist practicing to reach bottom cabinet 10x2 Forward step ups onto regular step with bilateral UE assist 10x3 with L LE   Improved exercise technique, movement at target joints, use of target muscles after min verbal, visual, tactile cues.      Pt has demonstrated improved L hip strength, overall decreased L hip pain, improved LEFS score suggesting increased function since initial evaluation. Patient has made very good progress towards goals. Skilled physical therapy services discharged with patient continuing progress with her exercises at home.                    PT Education - 04/08/15 1925    Education provided Yes   Education Details ther-ex, STM L hip   Person(s) Educated Patient   Methods Explanation;Demonstration;Tactile cues;Verbal cues   Comprehension Verbalized understanding;Returned demonstration             PT Long Term Goals - 04/08/15 1926    PT LONG TERM GOAL #1   Title Patient will improve L hip strength by at least 1/2 MMT grade to promote ability to ambulate, and negotiate stairs.    Time 4   Period Weeks   Status Achieved   PT LONG TERM GOAL #2   Title Patient will improve her LEFS score by at least 9 points as a demonstration of improved function.    Time 4   Period Weeks   Status Achieved   PT LONG TERM GOAL #3   Title Patient will have a decrease in L hip pain to 2/10 or less at worst to improve ability to ambulate.    Time 4   Period Weeks   Status On-going   PT LONG TERM GOAL #4   Title Patient will improve her LEFS score to at least 65/80 as a demonstration of improved function.    Baseline 56/80; current score: 69/80   Time 4   Period Weeks   Status Achieved               Plan - 04/08/15 1749    Clinical Impression Statement Pt has demonstrated improved L hip strength, overall decreased L hip pain, improved LEFS score suggesting increased function since initial evaluation. Patient has made very good progress towards goals. Skilled physical therapy services discharged with patient continuing progress with her exercises at home.     Pt will benefit from skilled therapeutic intervention in order to improve on the following deficits Abnormal gait;Pain;Difficulty walking;Decreased strength;Improper body mechanics   Rehab Potential Good   Clinical Impairments Affecting Rehab Potential No known clinical impairments affecting rehab potential.    PT Frequency 2x / week   PT Duration 4 weeks   PT Treatment/Interventions Therapeutic activities;Manual techniques;Therapeutic  exercise;Aquatic Therapy;Gait training;Scar mobilization;Patient/family education;Ultrasound   PT Next Visit Plan Soft tissue mobilization to L hip muscles, glute med and max strengthening, gait, functional strengthening   Consulted and Agree with Plan of Care Patient        Problem List Patient Active Problem List   Diagnosis Date Noted  . Hypokalemia 12/08/2014  . Hyperglycemia 12/08/2014  . Essential hypertension 12/08/2014  . Closed left hip fracture (HCC) 12/05/2014   Thank you for your referral.   Loralyn Freshwater PT, DPT   04/08/2015, 7:36 PM  [facility] PHYSICAL [facility] SPORTS MEDICINE [address] [place], [place], [id number] Phone: [phone]   Fax:  [phone]  Name: Lemmie Evensheresa O Croft MRN: [id number] Date of Birth: 1955-09-18

## 2015-07-08 ENCOUNTER — Other Ambulatory Visit: Payer: Self-pay | Admitting: Family Medicine

## 2015-07-08 DIAGNOSIS — Z1231 Encounter for screening mammogram for malignant neoplasm of breast: Secondary | ICD-10-CM

## 2015-07-29 ENCOUNTER — Ambulatory Visit
Admission: RE | Admit: 2015-07-29 | Discharge: 2015-07-29 | Disposition: A | Discharge status: Home / Self Care | Payer: BLUE CROSS/BLUE SHIELD | Source: Ambulatory Visit | Attending: Family Medicine | Admitting: Family Medicine

## 2015-07-29 DIAGNOSIS — Z1231 Encounter for screening mammogram for malignant neoplasm of breast: Secondary | ICD-10-CM | POA: Insufficient documentation

## 2016-06-29 ENCOUNTER — Other Ambulatory Visit: Payer: Self-pay | Admitting: Family Medicine

## 2016-06-29 DIAGNOSIS — Z1231 Encounter for screening mammogram for malignant neoplasm of breast: Secondary | ICD-10-CM

## 2016-08-01 ENCOUNTER — Ambulatory Visit
Admission: RE | Admit: 2016-08-01 | Discharge: 2016-08-01 | Disposition: A | Discharge status: Home / Self Care | Payer: BLUE CROSS/BLUE SHIELD | Source: Ambulatory Visit | Attending: Family Medicine | Admitting: Family Medicine

## 2016-08-01 ENCOUNTER — Encounter (INDEPENDENT_AMBULATORY_CARE_PROVIDER_SITE_OTHER): Payer: Self-pay

## 2016-08-01 DIAGNOSIS — Z1231 Encounter for screening mammogram for malignant neoplasm of breast: Secondary | ICD-10-CM | POA: Insufficient documentation

## 2017-04-27 ENCOUNTER — Other Ambulatory Visit: Payer: Self-pay | Admitting: Family Medicine

## 2017-04-27 DIAGNOSIS — E039 Hypothyroidism, unspecified: Secondary | ICD-10-CM

## 2017-05-15 ENCOUNTER — Ambulatory Visit
Admission: RE | Admit: 2017-05-15 | Discharge: 2017-05-15 | Disposition: A | Discharge status: Home / Self Care | Payer: BLUE CROSS/BLUE SHIELD | Source: Ambulatory Visit | Attending: Family Medicine | Admitting: Family Medicine

## 2017-05-15 DIAGNOSIS — E039 Hypothyroidism, unspecified: Secondary | ICD-10-CM | POA: Insufficient documentation

## 2017-05-15 DIAGNOSIS — E042 Nontoxic multinodular goiter: Secondary | ICD-10-CM | POA: Diagnosis not present

## 2017-07-10 ENCOUNTER — Other Ambulatory Visit: Payer: Self-pay | Admitting: Family Medicine

## 2017-07-10 DIAGNOSIS — Z1231 Encounter for screening mammogram for malignant neoplasm of breast: Secondary | ICD-10-CM

## 2017-08-02 ENCOUNTER — Ambulatory Visit
Admission: RE | Admit: 2017-08-02 | Discharge: 2017-08-02 | Disposition: A | Discharge status: Home / Self Care | Payer: BLUE CROSS/BLUE SHIELD | Source: Ambulatory Visit | Attending: Family Medicine | Admitting: Family Medicine

## 2017-08-02 DIAGNOSIS — Z1231 Encounter for screening mammogram for malignant neoplasm of breast: Secondary | ICD-10-CM | POA: Diagnosis not present

## 2017-09-16 IMAGING — CR DG CHEST 1V
1 series · 1 of 1 positions shown · non-contrast
Comparison: None.

CLINICAL DATA: Pain following fall

EXAM:
CHEST 1 VIEW

[chest ap]
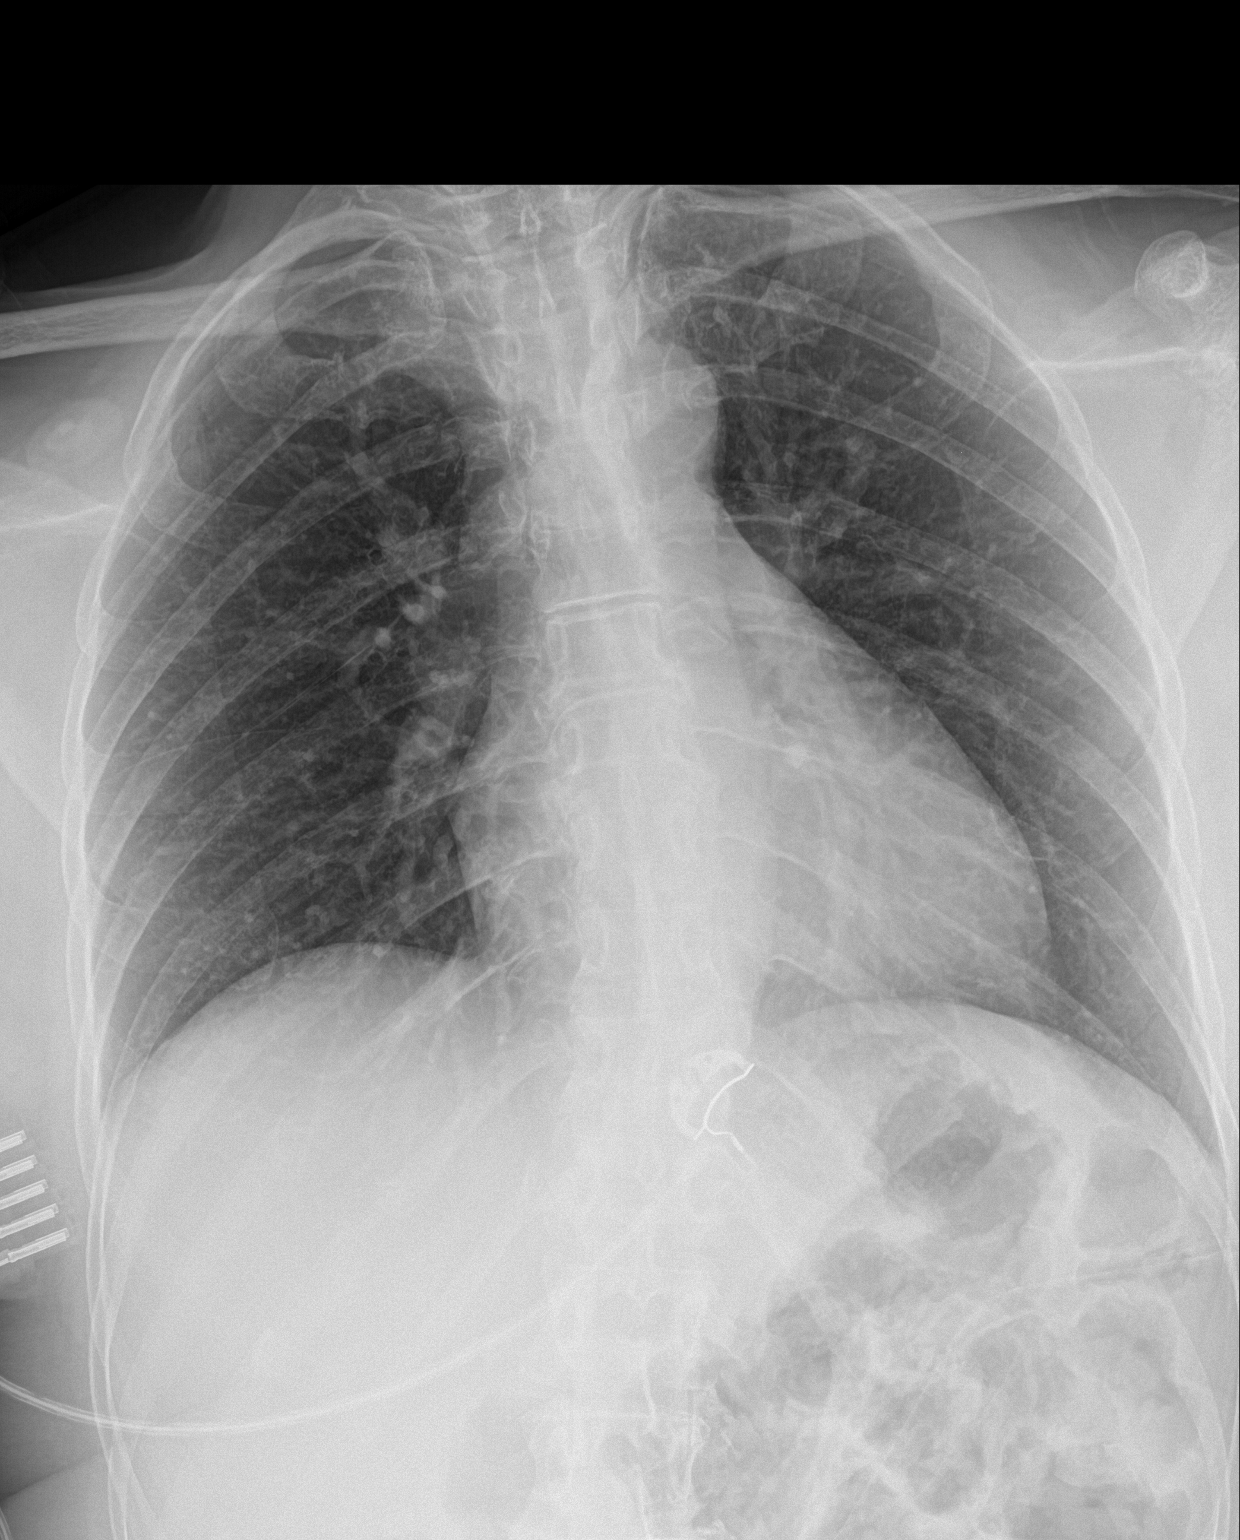

[1 of 1 positions shown; findings below may reference images not displayed]

FINDINGS: Lungs are clear. Heart size and pulmonary vascularity are normal. No
adenopathy. No pneumothorax. There is thoracolumbar levoscoliosis.
No acute fracture evident.
IMPRESSION: No edema or consolidation.

## 2018-05-23 ENCOUNTER — Ambulatory Visit
Admission: RE | Admit: 2018-05-23 | Payer: BLUE CROSS/BLUE SHIELD | Source: Home / Self Care | Admitting: Internal Medicine

## 2018-05-23 ENCOUNTER — Encounter: Admission: RE | Payer: Self-pay | Source: Home / Self Care

## 2018-05-23 SURGERY — COLONOSCOPY WITH PROPOFOL
Anesthesia: General

## 2019-05-14 ENCOUNTER — Other Ambulatory Visit: Payer: Self-pay | Admitting: Family Medicine

## 2019-05-14 DIAGNOSIS — Z1231 Encounter for screening mammogram for malignant neoplasm of breast: Secondary | ICD-10-CM

## 2019-05-14 IMAGING — MG MM DIGITAL SCREENING BILAT W/ CAD
4 series · 4 of 4 positions shown · non-contrast
Comparison: Previous exam(s).

CLINICAL DATA: Screening.

EXAM:
DIGITAL SCREENING BILATERAL MAMMOGRAM WITH CAD

[L MLO]
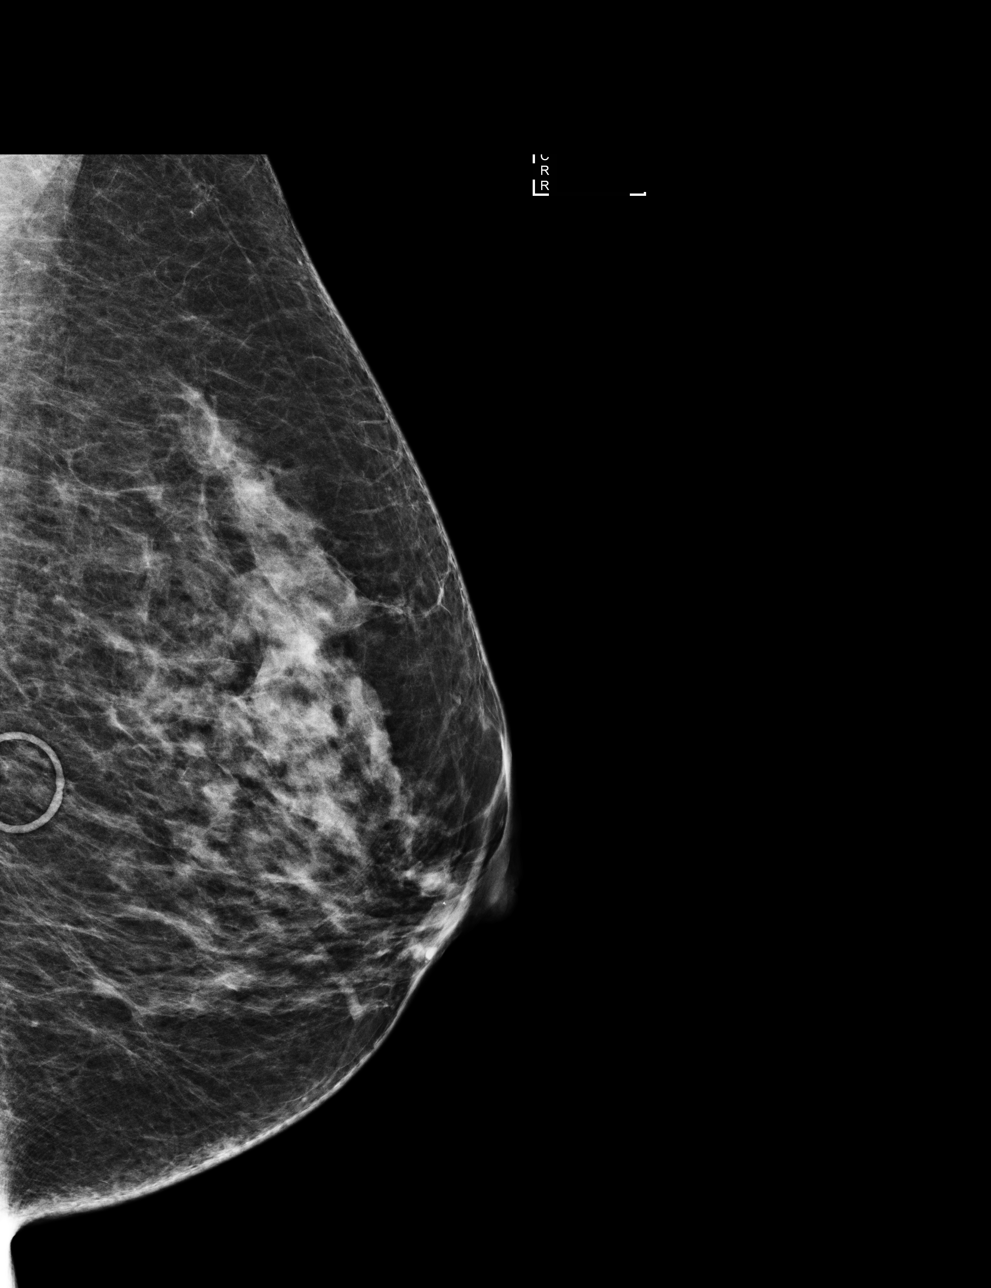

[R CC]
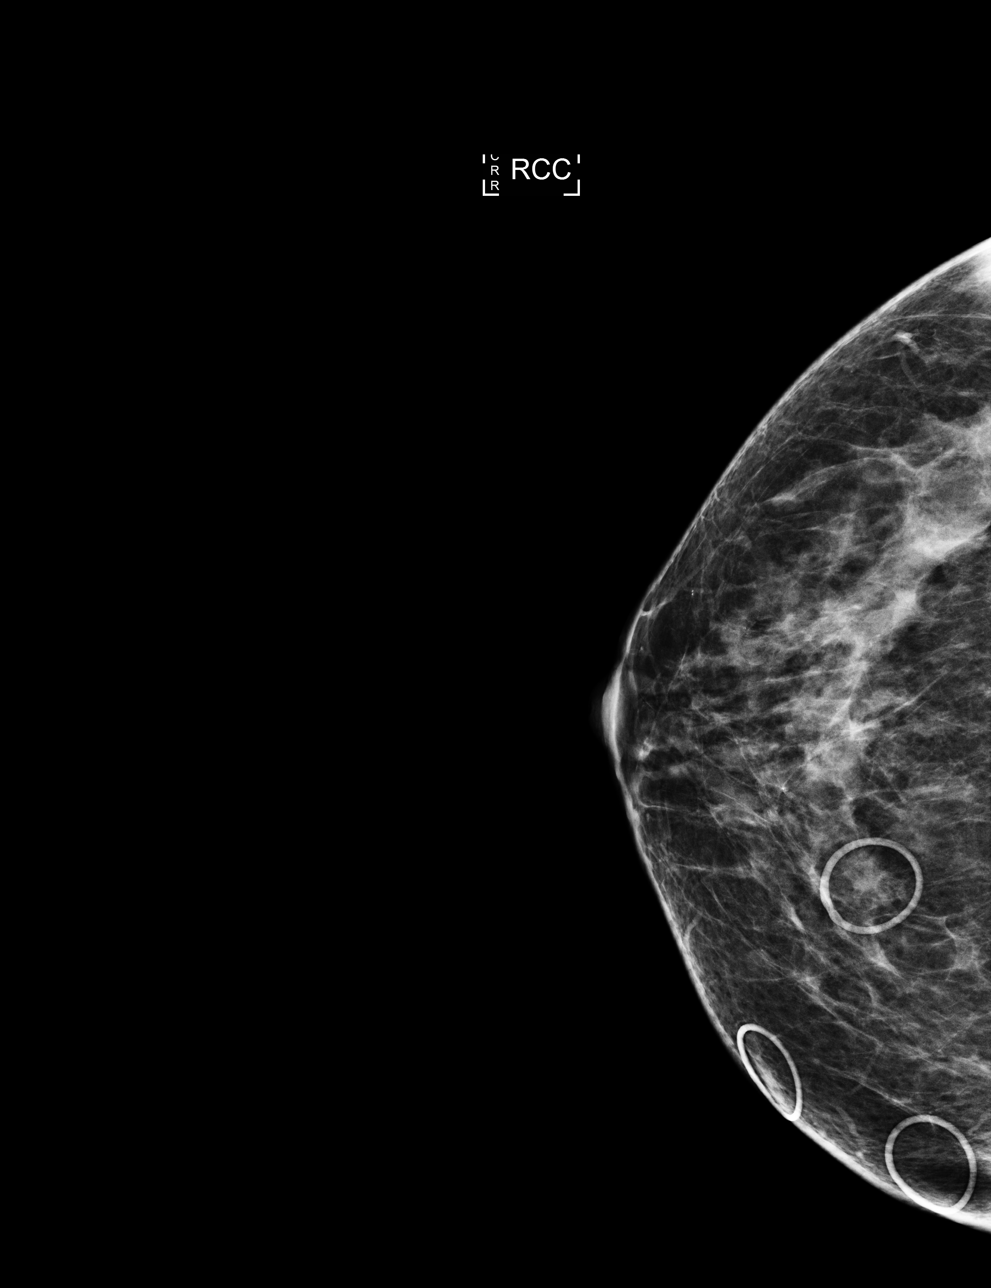

[R MLO]
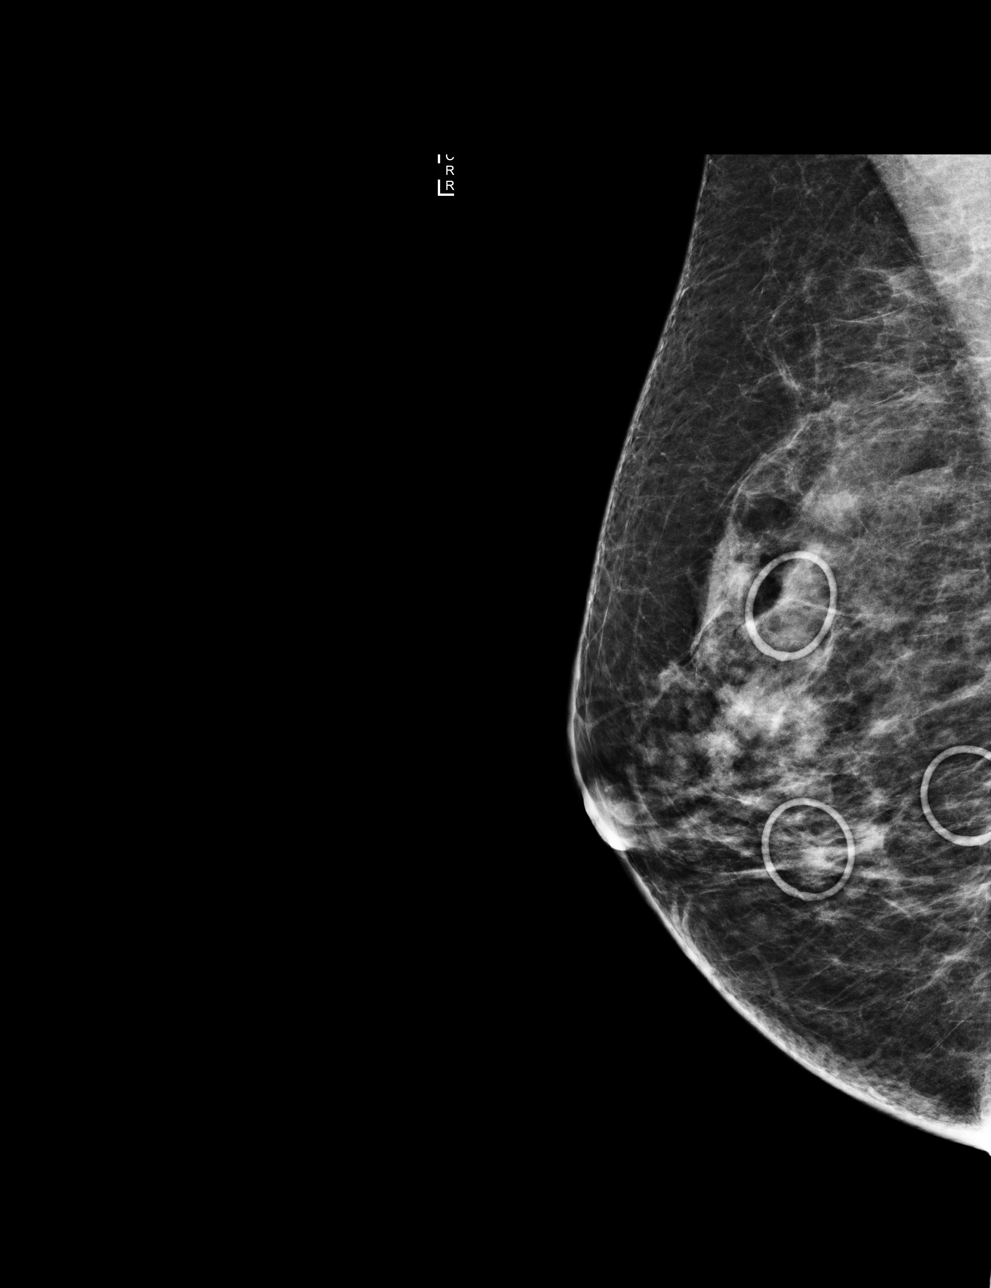

[L CC]
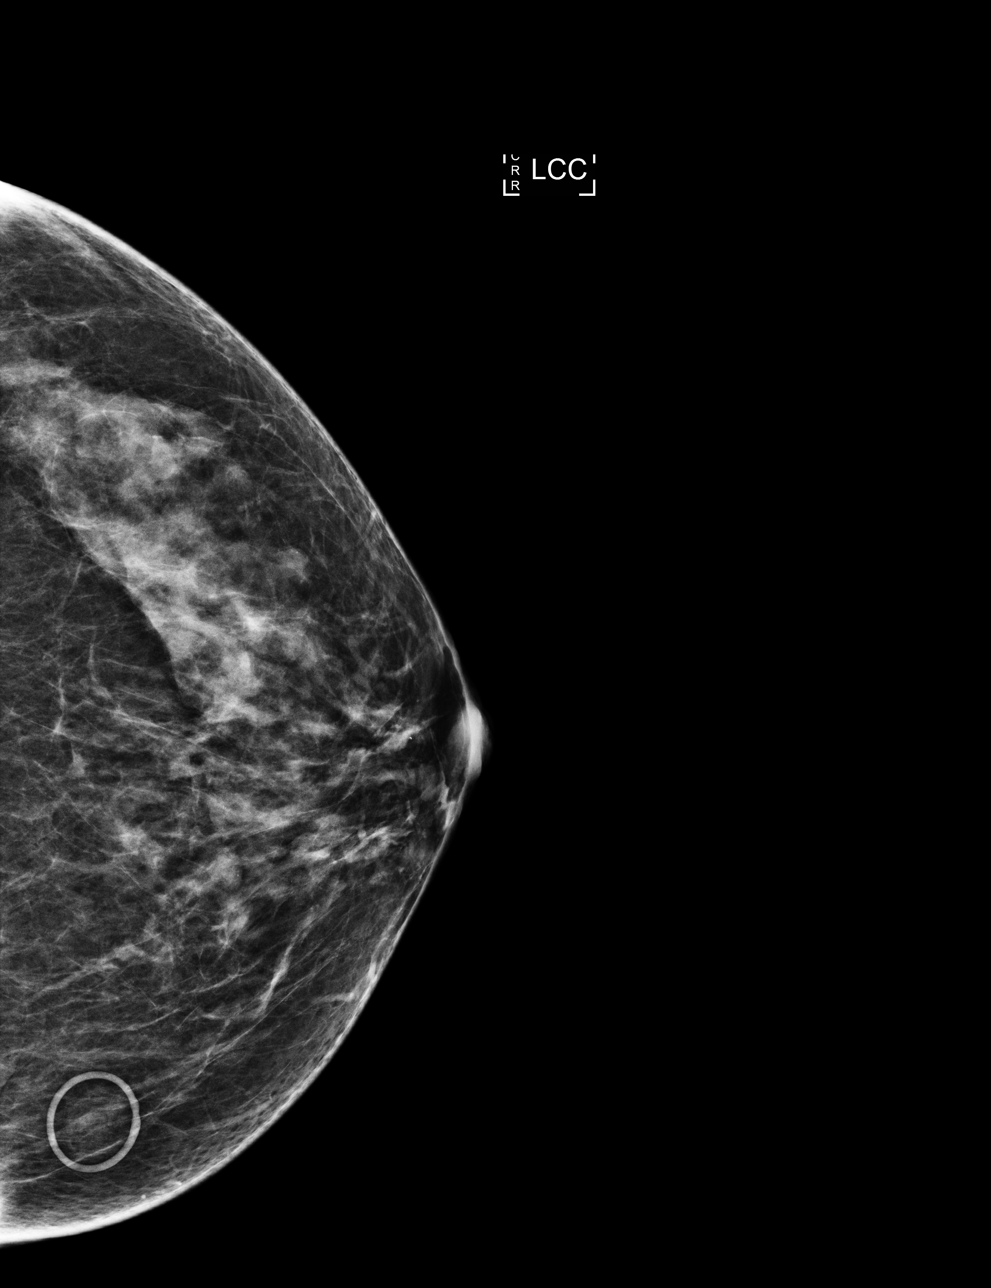

[4 of 4 positions shown; findings below may reference images not displayed]

ACR Breast Density Category c: The breast tissue is heterogeneously
dense, which may obscure small masses.
FINDINGS: There are no findings suspicious for malignancy. Images were
processed with CAD.
IMPRESSION: No mammographic evidence of malignancy. A result letter of this
screening mammogram will be mailed directly to the patient.

RECOMMENDATION:
Screening mammogram in one year. (Code:YJ-[id number])

BI-RADS CATEGORY  1: Negative.

## 2019-05-16 ENCOUNTER — Other Ambulatory Visit: Payer: Self-pay | Admitting: Family Medicine

## 2019-05-16 DIAGNOSIS — E041 Nontoxic single thyroid nodule: Secondary | ICD-10-CM

## 2019-05-16 DIAGNOSIS — E039 Hypothyroidism, unspecified: Secondary | ICD-10-CM

## 2019-05-22 ENCOUNTER — Encounter (INDEPENDENT_AMBULATORY_CARE_PROVIDER_SITE_OTHER): Payer: Self-pay

## 2019-05-22 ENCOUNTER — Ambulatory Visit
Admission: RE | Admit: 2019-05-22 | Discharge: 2019-05-22 | Disposition: A | Discharge status: Home / Self Care | Payer: PRIVATE HEALTH INSURANCE | Source: Ambulatory Visit | Attending: Family Medicine | Admitting: Family Medicine

## 2019-05-22 ENCOUNTER — Other Ambulatory Visit: Payer: Self-pay

## 2019-05-22 DIAGNOSIS — E041 Nontoxic single thyroid nodule: Secondary | ICD-10-CM | POA: Diagnosis present

## 2019-05-22 DIAGNOSIS — E039 Hypothyroidism, unspecified: Secondary | ICD-10-CM

## 2019-05-27 ENCOUNTER — Ambulatory Visit
Admission: RE | Admit: 2019-05-27 | Discharge: 2019-05-27 | Disposition: A | Discharge status: Home / Self Care | Payer: PRIVATE HEALTH INSURANCE | Source: Ambulatory Visit | Attending: Family Medicine | Admitting: Family Medicine

## 2019-05-27 ENCOUNTER — Other Ambulatory Visit: Payer: Self-pay

## 2019-05-27 DIAGNOSIS — Z1231 Encounter for screening mammogram for malignant neoplasm of breast: Secondary | ICD-10-CM | POA: Diagnosis not present

## 2020-06-10 ENCOUNTER — Ambulatory Visit: Payer: Self-pay | Attending: Oncology

## 2020-06-10 ENCOUNTER — Ambulatory Visit
Admission: RE | Admit: 2020-06-10 | Discharge: 2020-06-10 | Disposition: A | Discharge status: Home / Self Care | Payer: Self-pay | Source: Ambulatory Visit | Attending: Oncology | Admitting: Oncology

## 2020-06-10 ENCOUNTER — Other Ambulatory Visit: Payer: Self-pay

## 2020-06-10 VITALS — BP 142/84 | HR 94 | Temp 97.2°F | Ht 64.9 in | Wt 161.3 lb

## 2020-06-10 DIAGNOSIS — Z0000 Encounter for general adult medical examination without abnormal findings: Secondary | ICD-10-CM

## 2020-06-10 NOTE — Progress Notes (Signed)
  Subjective:     Patient ID: Latoya Warren, female   DOB: Dec 06, 1955, 65 y.o.   MRN: [id number]  HPI   Review of Systems     Objective:   Physical Exam Chest:  Breasts:     Right: No swelling, bleeding, inverted nipple, mass, nipple discharge, skin change or tenderness.     Left: No swelling, bleeding, inverted nipple, mass, nipple discharge, skin change or tenderness.          Assessment:     65 year old patient presents for [facility] clinic visit.  Patient screened, and meets [facility] eligibility.  Patient does not have insurance, Medicare or Medicaid. Instructed patient on breast self awareness using teach back method.  Clinical breast exam unremarkable.  No mass or lump palpated.   Risk Assessment    Risk Scores      06/10/2020   Last edited by: Jim Like, RN   5-year risk: 1.6 %   Lifetime risk: 6.3 %            Plan:     Sent for bilateral screening mammogram.

## 2020-06-15 NOTE — Progress Notes (Signed)
Letter mailed from [facility] to notify of normal mammogram results.  Patient to return in one year for annual screening.  Copy to [facility]. 

## 2021-05-31 ENCOUNTER — Other Ambulatory Visit: Payer: Self-pay | Admitting: Family Medicine

## 2021-05-31 DIAGNOSIS — Z1231 Encounter for screening mammogram for malignant neoplasm of breast: Secondary | ICD-10-CM

## 2021-05-31 DIAGNOSIS — Z780 Asymptomatic menopausal state: Secondary | ICD-10-CM

## 2021-06-04 LAB — COLOGUARD: COLOGUARD: NEGATIVE

## 2021-07-22 ENCOUNTER — Ambulatory Visit
Admission: RE | Admit: 2021-07-22 | Discharge: 2021-07-22 | Disposition: A | Discharge status: Home / Self Care | Payer: Medicare Other | Source: Ambulatory Visit | Attending: Family Medicine | Admitting: Family Medicine

## 2021-07-22 DIAGNOSIS — Z780 Asymptomatic menopausal state: Secondary | ICD-10-CM | POA: Insufficient documentation

## 2021-07-22 DIAGNOSIS — Z1231 Encounter for screening mammogram for malignant neoplasm of breast: Secondary | ICD-10-CM | POA: Insufficient documentation

## 2021-12-02 ENCOUNTER — Ambulatory Visit: Payer: Self-pay | Admitting: Dermatology

## 2022-06-01 ENCOUNTER — Other Ambulatory Visit: Payer: Self-pay

## 2022-06-01 DIAGNOSIS — Z1231 Encounter for screening mammogram for malignant neoplasm of breast: Secondary | ICD-10-CM

## 2022-07-25 ENCOUNTER — Ambulatory Visit
Admission: RE | Admit: 2022-07-25 | Discharge: 2022-07-25 | Disposition: A | Discharge status: Home / Self Care | Payer: Medicare Other | Source: Ambulatory Visit | Attending: Family Medicine | Admitting: Family Medicine

## 2022-07-25 DIAGNOSIS — Z1231 Encounter for screening mammogram for malignant neoplasm of breast: Secondary | ICD-10-CM | POA: Diagnosis present

## 2023-06-29 ENCOUNTER — Other Ambulatory Visit: Payer: Self-pay | Admitting: Family Medicine

## 2023-06-29 DIAGNOSIS — Z1231 Encounter for screening mammogram for malignant neoplasm of breast: Secondary | ICD-10-CM

## 2023-07-31 ENCOUNTER — Ambulatory Visit
Admission: RE | Admit: 2023-07-31 | Discharge: 2023-07-31 | Disposition: A | Discharge status: Home / Self Care | Source: Ambulatory Visit | Attending: Family Medicine | Admitting: Family Medicine

## 2023-07-31 DIAGNOSIS — Z1231 Encounter for screening mammogram for malignant neoplasm of breast: Secondary | ICD-10-CM | POA: Diagnosis present
